# Patient Record
Sex: Male | Born: 1980 | Race: Black or African American | Hispanic: No | Marital: Single | Smoking: Former smoker
Health system: Southern US, Community
[De-identification: ages and names within clinical notes are randomized; demographics above are authoritative.]

## PROBLEM LIST (undated history)

## (undated) DIAGNOSIS — I10 Essential (primary) hypertension: Secondary | ICD-10-CM

---

## 2013-04-14 HISTORY — PX: OTHER SURGICAL HISTORY: SHX169

## 2013-04-19 DIAGNOSIS — S5420XA Injury of radial nerve at forearm level, unspecified arm, initial encounter: Secondary | ICD-10-CM | POA: Insufficient documentation

## 2013-04-25 DIAGNOSIS — T884XXA Failed or difficult intubation, initial encounter: Secondary | ICD-10-CM | POA: Insufficient documentation

## 2013-09-11 ENCOUNTER — Encounter: Payer: Self-pay | Admitting: Plastic Surgery

## 2013-09-25 ENCOUNTER — Encounter: Payer: Self-pay | Admitting: Plastic Surgery

## 2015-01-25 HISTORY — PX: TENDON TRANSPLANT: SHX2488

## 2016-12-20 ENCOUNTER — Emergency Department
Admission: EM | Admit: 2016-12-20 | Discharge: 2016-12-20 | Disposition: A | Discharge status: Home / Self Care | Payer: 59 | Attending: Emergency Medicine | Admitting: Emergency Medicine

## 2016-12-20 DIAGNOSIS — R361 Hematospermia: Secondary | ICD-10-CM | POA: Diagnosis not present

## 2016-12-20 DIAGNOSIS — F17200 Nicotine dependence, unspecified, uncomplicated: Secondary | ICD-10-CM | POA: Insufficient documentation

## 2016-12-20 LAB — URINALYSIS, COMPLETE (UACMP) WITH MICROSCOPIC
Bacteria, UA: NONE SEEN
Bilirubin Urine: NEGATIVE
GLUCOSE, UA: NEGATIVE mg/dL
Ketones, ur: NEGATIVE mg/dL
Leukocytes, UA: NEGATIVE
Nitrite: NEGATIVE
PROTEIN: NEGATIVE mg/dL
SQUAMOUS EPITHELIAL / LPF: NONE SEEN
Specific Gravity, Urine: 1.008 (ref 1.005–1.030)
pH: 7 (ref 5.0–8.0)

## 2016-12-20 LAB — CHLAMYDIA/NGC RT PCR (ARMC ONLY)
Chlamydia Tr: NOT DETECTED
N gonorrhoeae: NOT DETECTED

## 2016-12-20 MED ORDER — CIPROFLOXACIN HCL 500 MG PO TABS: 500.0000 mg | ORAL_TABLET | Freq: Two times a day (BID) | ORAL | 0 refills | Status: AC

## 2016-12-20 NOTE — ED Provider Notes (Signed)
Time Seen: Approximately 2022  I have reviewed the triage notes  Chief Complaint: Penile Discharge   History of Present Illness: Christopher Hardin is a 36 y.o. male *who states he's noticed on 2 separate occasions some blood in his ejaculate material. He denies any hematuria or penile discharge or drainage. He is obviously sexually active with single partner with his wife. He denies her having any issues at this time. He is not aware of any fever or direct trauma. He denies any testicular pain or masses   No past medical history on file.  There are no active problems to display for this patient.   No past surgical history on file.  No past surgical history on file.  Current Outpatient Rx  . Order #: [id number] Class: Print    Allergies:  Patient has no known allergies.  Family History: No family history on file.  Social History: Social History  Substance Use Topics  . Smoking status: Not on file  . Smokeless tobacco: Not on file  . Alcohol use Not on file     Review of Systems:   10 point review of systems was performed and was otherwise negative:  Constitutional: No fever Abdomen: No abdominal pain, no vomiting, No diarrhea Endocrine: No weight loss, No night sweats Urologic: No dysuria, Hematuria, or urinary frequency   Physical Exam:  ED Triage Vitals  Enc Vitals Group     BP 12/20/16 1947 (!) 151/97     Pulse Rate 12/20/16 1947 86     Resp 12/20/16 1947 18     Temp 12/20/16 1947 98.8 F (37.1 C)     Temp Source 12/20/16 1947 Oral     SpO2 12/20/16 1947 100 %     Weight 12/20/16 1945 162 lb (73.5 kg)     Height 12/20/16 1945 5\' 11"  (1.803 m)     Head Circumference --      Peak Flow --      Pain Score --      Pain Loc --      Pain Edu? --      [facility] --     General: Awake , Alert , and Oriented times 3; GCS 15  Lungs: Clear to ascultation without wheezes , rhonchi, or rales Heart: Regular rate, regular rhythm without murmurs ,  gallops , or rubs Abdomen: Soft, non tender without rebound, guarding , or rigidity; bowel sounds positive and symmetric in all 4 quadrants. No organomegaly .         Skin: warm, dry, no rashes Examination of the genitalia shows no penile discharge or drainage. No skin lesions. No testicular pain or masses are noted. Close have a normal position and lie within the scrotal sac with normal cremaster reflex  Labs:   All laboratory work was reviewed including any pertinent negatives or positives listed below:  Labs Reviewed  URINALYSIS, COMPLETE (UACMP) WITH MICROSCOPIC - Abnormal; Notable for the following:       Result Value   Color, Urine COLORLESS (*)    APPearance CLEAR (*)    Hgb urine dipstick SMALL (*)    All other components within normal limits  CHLAMYDIA/NGC RT PCR ([facility] ONLY)    ED Course Patient's stay here was uneventful and given his presentation I felt like this would be a benign pathology for his bloody ejaculate material. Patient was placed on Cipro for possible prostatitis. Skin advised return here if he develops a fever or notices any testicular pain  or masses or any other concerns. GC and chlamydia are pending though I suspected these will be negative and I felt we did not need to treat him here in emergency department with antibiotic therapy. Patient was referred to urology unassigned.  Final Clinical Impression:   Final diagnoses:  Bloody ejaculation     Plan:  Outpatient Patient was advised to return immediately if condition worsens. Patient was advised to follow up with their primary care physician or other specialized physicians involved in their outpatient care. The patient and/or family member/power of attorney had laboratory results reviewed at the bedside. All questions and concerns were addressed and appropriate discharge instructions were distributed by the nursing staff.             Jennye Moccasin, MD 12/20/16 [phone]

## 2016-12-20 NOTE — ED Notes (Signed)
Pt c/o bleeding from his penis

## 2016-12-20 NOTE — ED Triage Notes (Addendum)
Patient reports that when he ejaculates it is bloody.  Denies any problems urinating, other penile discharge or urinary symptoms.

## 2016-12-20 NOTE — Discharge Instructions (Signed)
Blood in the ejaculate material usually turns out to be a benign event. Cause at this time is unknown but if it continues please contact the urologist for further outpatient management.  Please return immediately if condition worsens. Please contact her primary physician or the physician you were given for referral. If you have any specialist physicians involved in her treatment and plan please also contact them. Thank you for using [facility].

## 2016-12-20 NOTE — ED Notes (Signed)
Blood when ejaculating X 1 week.

## 2016-12-20 NOTE — ED Notes (Signed)
ED Provider at bedside. 

## 2016-12-22 ENCOUNTER — Encounter: Payer: Self-pay | Admitting: Urology

## 2016-12-22 ENCOUNTER — Ambulatory Visit: Payer: 59 | Admitting: Urology

## 2016-12-22 VITALS — BP 151/101 | HR 97 | Ht 71.0 in | Wt 174.0 lb

## 2016-12-22 DIAGNOSIS — R361 Hematospermia: Secondary | ICD-10-CM

## 2016-12-22 LAB — URINALYSIS, COMPLETE
Bilirubin, UA: NEGATIVE
GLUCOSE, UA: NEGATIVE
KETONES UA: NEGATIVE
LEUKOCYTES UA: NEGATIVE
NITRITE UA: NEGATIVE
PROTEIN UA: NEGATIVE
RBC, UA: NEGATIVE
SPEC GRAV UA: 1.025 (ref 1.005–1.030)
Urobilinogen, Ur: 0.2 mg/dL (ref 0.2–1.0)
pH, UA: 7 (ref 5.0–7.5)

## 2016-12-22 LAB — MICROSCOPIC EXAMINATION
Bacteria, UA: NONE SEEN
Epithelial Cells (non renal): NONE SEEN /hpf (ref 0–10)
RBC, UA: NONE SEEN /hpf (ref 0–?)
WBC, UA: NONE SEEN /hpf (ref 0–?)

## 2016-12-22 NOTE — Progress Notes (Signed)
12/22/2016 8:03 AM   Helyn NumbersBryon Waldemar DickensJeffrey Mccasland Jul 27, 1981 [id number]  Referring provider: No referring provider defined for this encounter.  Chief Complaint  Patient presents with  . New Patient (Initial Visit)    HPI: Pleasant otherwise healthy 36 year old who presents for further evaluation of hematospermia.  He first noticed this on Thursday and again Saturday last week which was quite alarming to him. He denied any penile trauma, urethral discharge, urethral trauma, pain with ejaculation, change in his urinary symptoms, or any other associated symptoms.  No prior history of blood in his semen. No history of sexually transmitted infections.  He was seen and evaluated in the emergency room over the weekend for this at which time his UA was negative along with GC and chlamydia.  He was prescribed Cipro as a precaution but has only taken 1 tablet thus far.    No family history of prostate cancer or prostate problems.  He is in a monogamous relationship with his wife.     PMH: No past medical history on file.  Surgical History: Past Surgical History:  Procedure Laterality Date  . Arm Surgery Right 04/14/2013  . 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 Always use your most recent med list.          ciprofloxacin 500 MG tablet Commonly known as:  CIPRO Take 1 tablet (500 mg total) by mouth 2 (two) times daily.       Allergies: No Known Allergies  Family History: Family History  Problem Relation Age of Onset  . Bladder Cancer Neg Hx   . Prostate cancer Neg Hx   . Kidney cancer Neg Hx     Social History:  reports that he has been smoking.  He has been smoking about 0.25 packs per day. He has never used smokeless tobacco. He reports that he drinks alcohol. He reports that he does not use drugs.  ROS: UROLOGY Frequent Urination?:  No Hard to postpone urination?: No Burning/pain with urination?: No Get up at night to urinate?: No Leakage of urine?: No Urine stream starts and stops?: No Trouble starting stream?: No Do you have to strain to urinate?: No Blood in urine?: No Urinary tract infection?: No Sexually transmitted disease?: No Injury to kidneys or bladder?: No Painful intercourse?: No Weak stream?: No Erection problems?: No Penile pain?: No  Gastrointestinal Nausea?: No Vomiting?: No Indigestion/heartburn?: No Diarrhea?: No Constipation?: No  Constitutional Fever: No Night sweats?: No Weight loss?: No Fatigue?: No  Skin Skin rash/lesions?: No Itching?: No  Eyes Blurred vision?: No Double vision?: No  Ears/Nose/Throat Sore throat?: No Sinus problems?: No  Hematologic/Lymphatic Swollen glands?: No Easy bruising?: No  Cardiovascular Leg swelling?: No Chest pain?: No  Respiratory Cough?: No Shortness of breath?: No  Endocrine Excessive thirst?: No  Musculoskeletal Back pain?: No Joint pain?: No  Neurological Headaches?: No Dizziness?: No  Psychologic Depression?: No Anxiety?: No  Physical Exam: BP (!) 151/101   Pulse 97   Ht 5\' 11"  (1.803 m)   Wt 174 lb (78.9 kg)   BMI 24.27 kg/m   Constitutional:  Alert and oriented, No acute distress. HEENT: [place] AT, moist mucus membranes.  Trachea midline, no masses. Cardiovascular: No clubbing, cyanosis, or edema. Respiratory: Normal respiratory effort, no increased work of breathing. GI: Abdomen is soft, nontender,  nondistended, no abdominal masses GU: No CVA tenderness. Bilateral descended testicles, no masses. Circumcised phallus with orthotopic meatus. No urethral discharge. Rectal exam: Normal sphincter tone. Small, firm, 30 cc prostate, no nodules. Nontender. Skin: No rashes, bruises or suspicious lesions. Lymph: No inguinal adenopathy. Neurologic: Grossly intact, no focal deficits, moving all 4  extremities. Psychiatric: Normal mood and affect.  Laboratory Data: N/a  Urinalysis Results for orders placed or performed in visit on 12/22/16  Microscopic Examination  Result Value Ref Range   WBC, UA None seen 0 - 5 /hpf   RBC, UA None seen 0 - 2 /hpf   Epithelial Cells (non renal) None seen 0 - 10 /hpf   Bacteria, UA None seen None seen/Few  Urinalysis, Complete  Result Value Ref Range   Specific Gravity, UA 1.025 1.005 - 1.030   pH, UA 7.0 5.0 - 7.5   Color, UA Yellow Yellow   Appearance Ur Clear Clear   Leukocytes, UA Negative Negative   Protein, UA Negative Negative/Trace   Glucose, UA Negative Negative   Ketones, UA Negative Negative   RBC, UA Negative Negative   Bilirubin, UA Negative Negative   Urobilinogen, Ur 0.2 0.2 - 1.0 mg/dL   Nitrite, UA Negative Negative   Microscopic Examination See below:     Pertinent Imaging: n/a  Assessment & Plan:    1. Hematospermia  I explained to the patient some of the conditions that may cause hematospermia, such as: disorders of the prostate gland, seminal vesicles, spermatic cord, and ejaculatory duct system; urogenital infections including sexually transmitted infections (eg, chlamydia, herpes simplex virus, gonorrhea, trichomonas); metastatic cancers; vascular malformations; congenital and drug-induced bleeding disorders; and even frequent daily ejaculation over a period of several weeks.  I reassured him that his exam was normal and that we may never discover a reason for his hematospermia and it is most likely a benign symptom.     I have advised him that it may take some time for his ejaculate to clear, but he should return if this fails to resolve greater than 6 weeks.  - Urinalysis, Complete   Vanna Scotland, MD  [facility] [address] [place], [place] [address] [phone]

## 2017-04-16 DIAGNOSIS — S46219A Strain of muscle, fascia and tendon of other parts of biceps, unspecified arm, initial encounter: Secondary | ICD-10-CM | POA: Diagnosis not present

## 2017-04-23 DIAGNOSIS — R5381 Other malaise: Secondary | ICD-10-CM | POA: Diagnosis not present

## 2017-04-23 DIAGNOSIS — I10 Essential (primary) hypertension: Secondary | ICD-10-CM | POA: Diagnosis not present

## 2017-04-23 DIAGNOSIS — Z0000 Encounter for general adult medical examination without abnormal findings: Secondary | ICD-10-CM | POA: Diagnosis not present

## 2017-04-23 DIAGNOSIS — E784 Other hyperlipidemia: Secondary | ICD-10-CM | POA: Diagnosis not present

## 2017-04-23 DIAGNOSIS — S46219A Strain of muscle, fascia and tendon of other parts of biceps, unspecified arm, initial encounter: Secondary | ICD-10-CM | POA: Diagnosis not present

## 2017-05-03 DIAGNOSIS — S46219A Strain of muscle, fascia and tendon of other parts of biceps, unspecified arm, initial encounter: Secondary | ICD-10-CM | POA: Diagnosis not present

## 2017-07-29 DIAGNOSIS — Z23 Encounter for immunization: Secondary | ICD-10-CM | POA: Diagnosis not present

## 2017-09-12 ENCOUNTER — Encounter: Payer: Self-pay | Admitting: Emergency Medicine

## 2017-09-12 ENCOUNTER — Emergency Department: Payer: 59

## 2017-09-12 ENCOUNTER — Emergency Department
Admission: EM | Admit: 2017-09-12 | Discharge: 2017-09-12 | Disposition: A | Discharge status: Home / Self Care | Payer: 59 | Attending: Emergency Medicine | Admitting: Emergency Medicine

## 2017-09-12 DIAGNOSIS — M25519 Pain in unspecified shoulder: Secondary | ICD-10-CM | POA: Diagnosis present

## 2017-09-12 DIAGNOSIS — F17200 Nicotine dependence, unspecified, uncomplicated: Secondary | ICD-10-CM | POA: Insufficient documentation

## 2017-09-12 DIAGNOSIS — S4991XA Unspecified injury of right shoulder and upper arm, initial encounter: Secondary | ICD-10-CM | POA: Diagnosis not present

## 2017-09-12 DIAGNOSIS — M25511 Pain in right shoulder: Secondary | ICD-10-CM

## 2017-09-12 NOTE — Discharge Instructions (Signed)
use the Motrin for the over the counter pills 3 times a day with food. That should help with the pain. You can use ice 20 minutes every hour if that helps. You can wear the sling with the strap over the right clavicle as we've discussed. Move your shoulder as tolerated. Call the orthopedic surgeon's office on Monday morning and explained to them that you was seen in the emergency room and seemed to have a grade 1 AC joint tear. They should be on a follow you up within a week or so. Please return here for increasing pain numbness or any worsening.

## 2017-09-12 NOTE — ED Triage Notes (Signed)
Patient states that he slipped and fell about 2 hours ago. Patient with complaint of right shoulder pain.

## 2017-09-12 NOTE — ED Notes (Signed)
Patient transported to X-ray 

## 2017-09-12 NOTE — ED Provider Notes (Signed)
[facility] Emergency Department Provider Note   ____________________________________________   First MD Initiated Contact with Patient 09/12/17 [phone]     (approximate)  I have reviewed the triage vital signs and the nursing notes.   HISTORY  Chief Complaint Fall and Shoulder Pain   HPI Christopher Hardin is a 36 y.o. male Who reports he fell a few hours ago. His pain in his shoulders been getting worse. It is localized to the before meals joint area. He has no new numbness or weakness in his hands or fingers or arms    Past Surgical History:  Procedure Laterality Date  . Arm Surgery Right 04/14/2013  . 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  Family History  Problem Relation Age of Onset  . Bladder Cancer Neg Hx   . Prostate cancer Neg Hx   . Kidney cancer Neg Hx     Social History Social History   Tobacco Use  . Smoking status: Current Every Day Smoker    Packs/day: 0.25  . Smokeless tobacco: Never Used  Substance Use Topics  . Alcohol use: Yes  . Drug use: No    Review of Systems  Constitutional: No fever/chills Eyes: No visual changes. ENT: No sore throat. Cardiovascular: Denies chest pain. Respiratory: Denies shortness of breath. Gastrointestinal: No abdominal pain.  No nausea, no vomiting.  No diarrhea.  No constipation. Genitourinary: Negative for dysuria. Musculoskeletal: Negative for back pain. Skin: Negative for rash. Neurological: Negative for headaches, focal weakness  ____________________________________________   PHYSICAL EXAM:  VITAL SIGNS: ED Triage Vitals  Enc Vitals Group     BP 09/12/17 0417 (!) 157/94     Pulse Rate 09/12/17 0417 90     Resp 09/12/17 0417 18     Temp 09/12/17 0417 98.9 F (37.2 C)     Temp Source 09/12/17 0417 Oral     SpO2 09/12/17 0417 100 %     Weight 09/12/17 0410 160 lb  (72.6 kg)     Height 09/12/17 0410 6' (1.829 m)     Head Circumference --      Peak Flow --      Pain Score 09/12/17 0409 10     Pain Loc --      Pain Edu? --      Excl. in [facility]? --     Constitutional: Alert and oriented. Well appearing and in no acute distress. Eyes: Conjunctivae are normal.  Head: Atraumatic. Nose: No congestion/rhinnorhea. Mouth/Throat: Mucous membranes are moist.   Neck: No stridor.   Cardiovascular: Normal rate, regular rhythm.   Good peripheral circulation. Respiratory: Normal respiratory effort.  No retractions.  Musculoskeletal: No lower extremity tenderness nor edema.  No joint effusionsright arm and shoulder show no new numbness or weakness there is point tenderness over the before meals joint. There is no tenderness under the edge of the acromion or in the deltoid.Marland Kitchen. Neurologic:  Normal speech and language. No newgross focal neurologic deficits are appreciated.  Skin:  Skin is warm, dry and intact. No rash noted. Psychiatric: Mood and affect are normal. Speech and behavior are normal.  ____________________________________________   LABS (all labs ordered are listed, but only abnormal results are displayed)  Labs Reviewed - No data to display ____________________________________________  EKG   ____________________________________________  RADIOLOGY  Dg Shoulder Right  Result Date: 09/12/2017 CLINICAL DATA:  Initial  evaluation for acute pain status post fall. EXAM: RIGHT SHOULDER - 2+ VIEW COMPARISON:  None. FINDINGS: There is no evidence of fracture or dislocation. There is no evidence of arthropathy or other focal bone abnormality. Soft tissues are unremarkable. IMPRESSION: No acute osseous abnormality about the right shoulder. Electronically Signed   By: Rise MuBenjamin  McClintock M.D.   On: 09/12/2017 04:36  shoulder x-ray shows no fracture I reviewed the films  ____________________________________________   PROCEDURES  Procedure(s) performed:    Procedures  Critical Care performed:   ____________________________________________   INITIAL IMPRESSION / ASSESSMENT AND PLAN / ED COURSE  patient's exam is consistent with a grade 1 before meals tear. We will attempt to put on a sling and use the strap to put some compression on the before meals joint. This may help itself for me in the past 5 used it. Patient does not want anything but Motrin for pain which is very good.      ____________________________________________   FINAL CLINICAL IMPRESSION(S) / ED DIAGNOSES  Final diagnoses:  Acute pain of right shoulder   likely diagnosis is grade 1 before meals joint tear  ED Discharge Orders    None       Note:  This document was prepared using Dragon voice recognition software and may include unintentional dictation errors.    Arnaldo NatalMalinda, Icker Swigert F, MD 09/12/17 [phone]

## 2017-11-02 DIAGNOSIS — S46219A Strain of muscle, fascia and tendon of other parts of biceps, unspecified arm, initial encounter: Secondary | ICD-10-CM | POA: Diagnosis not present

## 2017-11-02 DIAGNOSIS — I10 Essential (primary) hypertension: Secondary | ICD-10-CM | POA: Diagnosis not present

## 2018-05-02 DIAGNOSIS — I10 Essential (primary) hypertension: Secondary | ICD-10-CM | POA: Diagnosis not present

## 2018-05-02 DIAGNOSIS — S46219D Strain of muscle, fascia and tendon of other parts of biceps, unspecified arm, subsequent encounter: Secondary | ICD-10-CM | POA: Diagnosis not present

## 2018-05-16 DIAGNOSIS — I10 Essential (primary) hypertension: Secondary | ICD-10-CM | POA: Diagnosis not present

## 2018-05-16 DIAGNOSIS — E7849 Other hyperlipidemia: Secondary | ICD-10-CM | POA: Diagnosis not present

## 2018-05-16 DIAGNOSIS — R5381 Other malaise: Secondary | ICD-10-CM | POA: Diagnosis not present

## 2018-05-16 DIAGNOSIS — I119 Hypertensive heart disease without heart failure: Secondary | ICD-10-CM | POA: Diagnosis not present

## 2018-05-16 DIAGNOSIS — S46219A Strain of muscle, fascia and tendon of other parts of biceps, unspecified arm, initial encounter: Secondary | ICD-10-CM | POA: Diagnosis not present

## 2018-05-19 DIAGNOSIS — I119 Hypertensive heart disease without heart failure: Secondary | ICD-10-CM | POA: Diagnosis not present

## 2018-05-19 DIAGNOSIS — S46219A Strain of muscle, fascia and tendon of other parts of biceps, unspecified arm, initial encounter: Secondary | ICD-10-CM | POA: Diagnosis not present

## 2018-08-18 DIAGNOSIS — I119 Hypertensive heart disease without heart failure: Secondary | ICD-10-CM | POA: Diagnosis not present

## 2018-08-18 DIAGNOSIS — S46219A Strain of muscle, fascia and tendon of other parts of biceps, unspecified arm, initial encounter: Secondary | ICD-10-CM | POA: Diagnosis not present

## 2018-08-18 DIAGNOSIS — F17200 Nicotine dependence, unspecified, uncomplicated: Secondary | ICD-10-CM | POA: Diagnosis not present

## 2019-04-11 ENCOUNTER — Other Ambulatory Visit: Payer: Self-pay | Admitting: Family

## 2019-04-11 DIAGNOSIS — R1033 Periumbilical pain: Secondary | ICD-10-CM

## 2019-04-17 ENCOUNTER — Other Ambulatory Visit: Payer: Self-pay

## 2019-04-17 ENCOUNTER — Ambulatory Visit
Admission: RE | Admit: 2019-04-17 | Discharge: 2019-04-17 | Disposition: A | Discharge status: Home / Self Care | Payer: BC Managed Care – PPO | Source: Ambulatory Visit | Attending: Family | Admitting: Family

## 2019-04-17 DIAGNOSIS — R1033 Periumbilical pain: Secondary | ICD-10-CM | POA: Diagnosis present

## 2019-10-02 ENCOUNTER — Other Ambulatory Visit: Payer: Self-pay

## 2019-10-02 ENCOUNTER — Emergency Department
Admission: EM | Admit: 2019-10-02 | Discharge: 2019-10-02 | Disposition: A | Discharge status: Home / Self Care | Payer: BC Managed Care – PPO | Attending: Emergency Medicine | Admitting: Emergency Medicine

## 2019-10-02 ENCOUNTER — Emergency Department: Payer: BC Managed Care – PPO

## 2019-10-02 ENCOUNTER — Encounter: Payer: Self-pay | Admitting: Emergency Medicine

## 2019-10-02 DIAGNOSIS — I10 Essential (primary) hypertension: Secondary | ICD-10-CM | POA: Diagnosis not present

## 2019-10-02 DIAGNOSIS — R109 Unspecified abdominal pain: Secondary | ICD-10-CM | POA: Diagnosis not present

## 2019-10-02 DIAGNOSIS — F17210 Nicotine dependence, cigarettes, uncomplicated: Secondary | ICD-10-CM | POA: Diagnosis not present

## 2019-10-02 LAB — CBC
HCT: 39.4 % (ref 39.0–52.0)
Hemoglobin: 13 g/dL (ref 13.0–17.0)
MCH: 26.6 pg (ref 26.0–34.0)
MCHC: 33 g/dL (ref 30.0–36.0)
MCV: 80.6 fL (ref 80.0–100.0)
Platelets: 210 10*3/uL (ref 150–400)
RBC: 4.89 MIL/uL (ref 4.22–5.81)
RDW: 14.5 % (ref 11.5–15.5)
WBC: 4.2 10*3/uL (ref 4.0–10.5)
nRBC: 0 % (ref 0.0–0.2)

## 2019-10-02 LAB — BASIC METABOLIC PANEL
Anion gap: 15 (ref 5–15)
BUN: 10 mg/dL (ref 6–20)
CO2: 22 mmol/L (ref 22–32)
Calcium: 9.6 mg/dL (ref 8.9–10.3)
Chloride: 102 mmol/L (ref 98–111)
Creatinine, Ser: 0.88 mg/dL (ref 0.61–1.24)
GFR calc Af Amer: >60 mL/min (ref >60)
GFR calc non Af Amer: >60 mL/min (ref >60)
Glucose, Bld: 95 mg/dL (ref 70–99)
Potassium: 3.5 mmol/L (ref 3.5–5.1)
Sodium: 139 mmol/L (ref 135–145)

## 2019-10-02 LAB — URINALYSIS, COMPLETE (UACMP) WITH MICROSCOPIC
Bacteria, UA: NONE SEEN
Bilirubin Urine: NEGATIVE
Glucose, UA: NEGATIVE mg/dL
Hgb urine dipstick: NEGATIVE
Ketones, ur: 5 mg/dL — AB
Leukocytes,Ua: NEGATIVE
Nitrite: NEGATIVE
Protein, ur: 30 mg/dL — AB
Specific Gravity, Urine: 1.021 (ref 1.005–1.030)
Squamous Epithelial / LPF: NONE SEEN (ref 0–5)
pH: 7 (ref 5.0–8.0)

## 2019-10-02 LAB — HEPATIC FUNCTION PANEL
ALT: 41 U/L (ref 0–44)
AST: 50 U/L — ABNORMAL HIGH (ref 15–41)
Albumin: 4.7 g/dL (ref 3.5–5.0)
Alkaline Phosphatase: 35 U/L — ABNORMAL LOW (ref 38–126)
Bilirubin, Direct: <0.1 mg/dL (ref 0.0–0.2)
Total Bilirubin: 0.5 mg/dL (ref 0.3–1.2)
Total Protein: 8 g/dL (ref 6.5–8.1)

## 2019-10-02 LAB — LIPASE, BLOOD: Lipase: 22 U/L (ref 11–51)

## 2019-10-02 MED ORDER — HYDROCHLOROTHIAZIDE 12.5 MG PO TABS: 12.5000 mg | ORAL_TABLET | Freq: Every day | ORAL | 0 refills | Status: DC

## 2019-10-02 MED ORDER — LIDOCAINE 5 % EX PTCH
1.0000 | MEDICATED_PATCH | CUTANEOUS | Status: DC
  Administered 2019-10-02: 1 via TRANSDERMAL
  Filled 2019-10-02: qty 1

## 2019-10-02 MED ORDER — KETOROLAC TROMETHAMINE 30 MG/ML IJ SOLN
30.0000 mg | Freq: Once | INTRAMUSCULAR | Status: AC
  Administered 2019-10-02: 30 mg via INTRAMUSCULAR
  Filled 2019-10-02: qty 1

## 2019-10-02 MED ORDER — CYCLOBENZAPRINE HCL 5 MG PO TABS: 5.0000 mg | ORAL_TABLET | Freq: Three times a day (TID) | ORAL | 0 refills | Status: DC | PRN

## 2019-10-02 MED ORDER — ACETAMINOPHEN 500 MG PO TABS
1000.0000 mg | ORAL_TABLET | Freq: Once | ORAL | Status: AC
  Administered 2019-10-02: 1000 mg via ORAL
  Filled 2019-10-02: qty 2

## 2019-10-02 NOTE — ED Triage Notes (Signed)
Pt reports started with intermittent pain to left flank and back. Pt reports that now the pain is constant.

## 2019-10-02 NOTE — ED Notes (Signed)
Sent green, purple tubes, and urine to lab.

## 2019-10-02 NOTE — ED Notes (Addendum)
Pt states he has high BP,but is not taking medication at this time.

## 2019-10-02 NOTE — ED Provider Notes (Signed)
[facility] Emergency Department Provider Note  ____________________________________________   First MD Initiated Contact with Patient 10/02/19 1443     (approximate)  I have reviewed the triage vital signs and the nursing notes.   HISTORY  Chief Complaint Flank Pain and Back Pain    HPI Christopher Hardin is a 38 y.o. male otherwise healthy comes in with left flank pain.  Patient states his pain started 3 days ago.  Initially was intermittent stabbing but now has been more constant.  The pain is constant, deep sharp sensation, nothing makes it better, nothing makes it worse.  Denies feeling like he strained it.  No prior kidney stones.  No burning when he urinates, no hematuria.  No testicle pain.    History reviewed. No pertinent past medical history.  There are no active problems to display for this patient.   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  Family History  Problem Relation Age of Onset   Bladder Cancer Neg Hx    Prostate cancer Neg Hx    Kidney cancer Neg Hx     Social History Social History   Tobacco Use   Smoking status: Current Every Day Smoker    Packs/day: 0.25   Smokeless tobacco: Never Used  Substance Use Topics   Alcohol use: Yes   Drug use: No      Review of Systems Constitutional: No fever/chills Eyes: No visual changes. ENT: No sore throat. Cardiovascular: Denies chest pain. Respiratory: Denies shortness of breath. Gastrointestinal: No abdominal pain.  No nausea, no vomiting.  No diarrhea.  No constipation. Genitourinary: Negative for dysuria. Musculoskeletal: Positive left flank pain Skin: Negative for rash. Neurological: Negative for headaches, focal weakness or numbness. All other ROS  negative ____________________________________________   PHYSICAL EXAM:  VITAL SIGNS: ED Triage Vitals  Enc Vitals Group     BP 10/02/19 1422 (!) 179/97     Pulse Rate 10/02/19 1422 83     Resp 10/02/19 1422 19     Temp 10/02/19 1422 98.4 F (36.9 C)     Temp Source 10/02/19 1422 Oral     SpO2 10/02/19 1422 100 %     Weight 10/02/19 1336 168 lb (76.2 kg)     Height 10/02/19 1336 6' (1.829 m)     Head Circumference --      Peak Flow --      Pain Score 10/02/19 1335 7     Pain Loc --      Pain Edu? --      Excl. in GC? --     Constitutional: Alert and oriented. Well appearing and in no acute distress. Eyes: Conjunctivae are normal. EOMI. Head: Atraumatic. Nose: No congestion/rhinnorhea. Mouth/Throat: Mucous membranes are moist.   Neck: No stridor. Trachea Midline. FROM Cardiovascular: Normal rate, regular rhythm. Grossly normal heart sounds.  Good peripheral circulation. Respiratory: Normal respiratory effort.  No retractions. Lungs CTAB. Gastrointestinal: Soft and nontender. No distention. No abdominal bruits.  Musculoskeletal: No lower extremity tenderness nor edema.  No joint effusions.  Tenderness to his left flank.  No skin changes. Neurologic:  Normal speech and language. No gross focal neurologic deficits are appreciated.  Skin:  Skin is warm, dry and intact. No rash noted. Psychiatric: Mood and affect are normal.  Speech and behavior are normal. GU: Deferred   ____________________________________________   LABS (all labs ordered are listed, but only abnormal results are displayed)  Labs Reviewed  URINALYSIS, COMPLETE (UACMP) WITH MICROSCOPIC - Abnormal; Notable for the following components:      Result Value   Color, Urine YELLOW (*)    APPearance CLEAR (*)    Ketones, ur 5 (*)    Protein, ur 30 (*)    All other components within normal limits  BASIC METABOLIC PANEL  CBC  HEPATIC FUNCTION PANEL  LIPASE, BLOOD    ____________________________________________   ED ECG REPORT I, Vanessa Ewing, the attending physician, personally viewed and interpreted this ECG.   ____________________________________________  RADIOLOGY   Official radiology report(s): Ct Renal Stone Study  Result Date: 10/02/2019 CLINICAL DATA:  Flank pain, stone disease suspected, intermittent pain to left flank and back. EXAM: CT ABDOMEN AND PELVIS WITHOUT CONTRAST TECHNIQUE: Multidetector CT imaging of the abdomen and pelvis was performed following the standard protocol without IV contrast. COMPARISON:  Abdominal ultrasound 04/17/2019 FINDINGS: Lower chest: Lung bases are clear. Normal heart size. No pericardial effusion. Hepatobiliary: Hypoattenuating focus adjacent the falciform ligament compatible with focal fatty infiltration. No worrisome focal liver abnormality is seen. No gallstones, gallbladder wall thickening, or biliary dilatation. Pancreas: Unremarkable. No pancreatic ductal dilatation or surrounding inflammatory changes. Spleen: Normal in size without focal abnormality. Adrenals/Urinary Tract: Adrenal glands are unremarkable. Kidneys are normal, without renal calculi, focal lesion, or hydronephrosis. Urinary bladder is largely decompressed at the time of exam and therefore poorly evaluated by CT imaging. Stomach/Bowel: Distal esophagus, stomach and duodenal sweep are unremarkable. No small bowel wall thickening or dilatation. No evidence of obstruction. A normal appendix is visualized. No colonic dilatation or wall thickening. Vascular/Lymphatic: The aorta is normal caliber. No suspicious or enlarged lymph nodes in the included lymphatic chains. Reproductive: The prostate and seminal vesicles are unremarkable. Other: No abdominopelvic free fluid or free gas. No bowel containing hernias. Musculoskeletal: Abrupt coccygeal angulation without lucency may reflect prior coccygeal fracture. No acute osseous abnormality or suspicious  osseous lesion. IMPRESSION: No acute CT findings to explain the patient's symptoms. Specifically, no evidence of urolithiasis or hydronephrosis. Electronically Signed   By: Lovena Le M.D.   On: 10/02/2019 15:08    ____________________________________________   PROCEDURES  Procedure(s) performed (including Critical Care):  Procedures   ____________________________________________   INITIAL IMPRESSION / ASSESSMENT AND PLAN / ED COURSE  Christopher Hardin was evaluated in Emergency Department on 10/02/2019 for the symptoms described in the history of present illness. He was evaluated in the context of the global COVID-19 pandemic, which necessitated consideration that the patient might be at risk for infection with the SARS-CoV-2 virus that causes COVID-19. Institutional protocols and algorithms that pertain to the evaluation of patients at risk for COVID-19 are in a state of rapid change based on information released by regulatory bodies including the CDC and federal and state organizations. These policies and algorithms were followed during the patient's care in the ED.    Patient is a well-appearing 38 year old who presents with left flank pain.  Will get CT scan to evaluate for renal stone.  Low suspicion for appendicitis, SBO, diverticulitis given location of the pain.  If work-up is negative consider musculoskeletal.  Will get labs to evaluate for UTI, Pyelo.  Denies any shortness of breath or cough to suggest pneumonia.  Denies any chest pain to suggest ACS, dissection.  Equal pulses as well.  Patient is noted  to be hypertensive he states that his blood pressure lesions because he does not want to.  Patient was informed to take these and he should follow-up with his primary care doctor.  We will start him on a low-dose of hydrochlorothiazide until he can get follow-up.   Labs are reassuring with no white count elevation.  Urine without evidence of UTI.  CT scan is negative.  No  evidence pancreatitis.  AST is slightly elevated patient follow-up as outpatient.  Discussed with patient work-up above.  Most likely musculoskeletal in nature.  Recommended Tylenol and ibuprofen and Flexeril for breakthrough pain instructed not to drive along nausea.  Patient is understanding but comfortable with this plan and understands return precautions such as worsening pain, vomiting, fevers.  I discussed the provisional nature of ED diagnosis, the treatment so far, the ongoing plan of care, follow up appointments and return precautions with the patient and any family or support people present. They expressed understanding and agreed with the plan, discharged home.  ____________________________________________   FINAL CLINICAL IMPRESSION(S) / ED DIAGNOSES   Final diagnoses:  Left flank pain  Hypertension, unspecified type      MEDICATIONS GIVEN DURING THIS VISIT:  Medications  lidocaine (LIDODERM) 5 % 1 patch (1 patch Transdermal Patch Applied 10/02/19 1453)  acetaminophen (TYLENOL) tablet 1,000 mg (1,000 mg Oral Given 10/02/19 1453)  ketorolac (TORADOL) 30 MG/ML injection 30 mg (30 mg Intramuscular Given 10/02/19 1619)     ED Discharge Orders         Ordered    cyclobenzaprine (FLEXERIL) 5 MG tablet  3 times daily PRN     10/02/19 1621    hydrochlorothiazide (HYDRODIURIL) 12.5 MG tablet  Daily     10/02/19 1621           Note:  This document was prepared using Dragon voice recognition software and may include unintentional dictation errors.   Concha Se, MD 10/02/19 [phone]

## 2019-10-02 NOTE — Discharge Instructions (Addendum)
Your labs show a slightly elevated AST will need to have this rechecked in the next few weeks just to make sure is downtrending.  This is most likely musculoskeletal in nature.  Take ibuprofen 400 every 8 hours and Tylenol 1 g every 8 hours.  Take the Flexeril at nighttime.  Do not drive with this.  Your blood pressure is also significantly elevated.  Seem to be important you restart your blood pressure medications.  I have started you on low dose medicine.  follow-up with your regular doctor in one week for Bp recheck.  Over time this can be life-threatening if you do not get this under control.  Return to the ER for worsening pain or any other concerns.    IMPRESSION: No acute CT findings to explain the patient's symptoms. Specifically, no evidence of urolithiasis or hydronephrosis.

## 2019-10-09 ENCOUNTER — Other Ambulatory Visit: Payer: Self-pay

## 2019-10-09 DIAGNOSIS — Z20822 Contact with and (suspected) exposure to covid-19: Secondary | ICD-10-CM

## 2019-10-10 LAB — NOVEL CORONAVIRUS, NAA: SARS-CoV-2, NAA: NOT DETECTED

## 2019-12-20 ENCOUNTER — Ambulatory Visit (INDEPENDENT_AMBULATORY_CARE_PROVIDER_SITE_OTHER): Payer: BC Managed Care – PPO | Admitting: Urology

## 2019-12-20 ENCOUNTER — Encounter: Payer: Self-pay | Admitting: Urology

## 2019-12-20 ENCOUNTER — Other Ambulatory Visit: Payer: Self-pay

## 2019-12-20 VITALS — BP 164/107 | HR 101 | Ht 72.0 in | Wt 170.0 lb

## 2019-12-20 DIAGNOSIS — R310 Gross hematuria: Secondary | ICD-10-CM

## 2019-12-20 LAB — URINALYSIS, COMPLETE
Bilirubin, UA: NEGATIVE
Glucose, UA: NEGATIVE
Ketones, UA: NEGATIVE
Leukocytes,UA: NEGATIVE
Nitrite, UA: NEGATIVE
RBC, UA: NEGATIVE
Specific Gravity, UA: 1.02 (ref 1.005–1.030)
Urobilinogen, Ur: 0.2 mg/dL (ref 0.2–1.0)
pH, UA: 7 (ref 5.0–7.5)

## 2019-12-20 LAB — MICROSCOPIC EXAMINATION
Bacteria, UA: NONE SEEN
Epithelial Cells (non renal): NONE SEEN /hpf (ref 0–10)
RBC: NONE SEEN /hpf (ref 0–2)

## 2019-12-20 NOTE — Progress Notes (Signed)
12/20/2019 10:50 AM   Ginnie Smart Basil Dess [id number] [id number]  Referring provider: Cletis Athens, MD [address] [place],  [place] [address]  Chief Complaint  Patient presents with  . Hematuria    HPI: Christopher Hardin 38 y.o. male seen in consultation at request of Dr. Lavera Guise for evaluation of gross hematuria.  Approximately 1 month ago he had a 1 week history of initial stream hematuria.  He denied dysuria, frequency or urgency.  No flank, abdominal or pelvic pain.  The day he was seen by his PCP he passed a small clot and has not had any further hematuria since that time.  He has no complaints today.  He has a previous tobacco history.  Only a dipstick urine was performed at his PCP evaluation which showed 2+ blood, 2+ protein and large leukocytes.  Urine was nitrite negative.  No culture was ordered however he was treated with Cipro.  He saw Dr. 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  He was seen in the ED on 10/02/2019 complaining of a 3-day history of left flank pain.  Urinalysis was performed which was unremarkable.  He had a stone protocol CT of the abdomen pelvis which negative for calculi or hydronephrosis.  His pain subsequently resolved   PMH: History reviewed. No pertinent past medical history.  Surgical History: Past Surgical History:  Procedure Laterality Date  . Arm Surgery Right 04/14/2013  . 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 If you have any questions, ask your nurse or doctor.        cyclobenzaprine 5 MG tablet Commonly known as: FLEXERIL Take 1 tablet (5 mg total) by mouth 3 (three) times daily as needed for muscle spasms.   hydrochlorothiazide 12.5 MG tablet Commonly known as: HYDRODIURIL Take 1 tablet (12.5 mg total) by mouth daily.   losartan 100 MG tablet Commonly known as: COZAAR Take 100 mg by  mouth daily.       Allergies: No Known Allergies  Family History: Family History  Problem Relation Age of Onset  . Bladder Cancer Neg Hx   . Prostate cancer Neg Hx   . Kidney cancer Neg Hx     Social History:  reports that he has been smoking. He has been smoking about 0.25 packs per day. He has never used smokeless tobacco. He reports current alcohol use. He reports that he does not use drugs.   Physical Exam: BP (!) 164/107   Pulse (!) 101   Ht 6' (1.829 m)   Wt 170 lb (77.1 kg)   BMI 23.06 kg/m   Constitutional:  Alert and oriented, No acute distress. HEENT: [place] AT, moist mucus membranes.  Trachea midline, no masses. Cardiovascular: No clubbing, cyanosis, or edema. Respiratory: Normal respiratory effort, no increased work of breathing. Skin: No rashes, bruises or suspicious lesions. Neurologic: Grossly intact, no focal deficits, moving all 4 extremities. Psychiatric: Normal mood and affect.    Pertinent Imaging: CT images were personally reviewed  Results for orders placed during the hospital encounter of 10/02/19  CT Renal Stone Study   Narrative CLINICAL DATA:  Flank pain, stone disease suspected, intermittent pain to left flank and back.  EXAM: CT ABDOMEN AND PELVIS WITHOUT CONTRAST  TECHNIQUE: Multidetector CT imaging of the abdomen and pelvis was performed following the standard protocol  without IV contrast.  COMPARISON:  Abdominal ultrasound 04/17/2019  FINDINGS: Lower chest: Lung bases are clear. Normal heart size. No pericardial effusion.  Hepatobiliary: Hypoattenuating focus adjacent the falciform ligament compatible with focal fatty infiltration. No worrisome focal liver abnormality is seen. No gallstones, gallbladder wall thickening, or biliary dilatation.  Pancreas: Unremarkable. No pancreatic ductal dilatation or surrounding inflammatory changes.  Spleen: Normal in size without focal abnormality.  Adrenals/Urinary Tract: Adrenal glands  are unremarkable. Kidneys are normal, without renal calculi, focal lesion, or hydronephrosis. Urinary bladder is largely decompressed at the time of exam and therefore poorly evaluated by CT imaging.  Stomach/Bowel: Distal esophagus, stomach and duodenal sweep are unremarkable. No small bowel wall thickening or dilatation. No evidence of obstruction. A normal appendix is visualized. No colonic dilatation or wall thickening.  Vascular/Lymphatic: The aorta is normal caliber. No suspicious or enlarged lymph nodes in the included lymphatic chains.  Reproductive: The prostate and seminal vesicles are unremarkable.  Other: No abdominopelvic free fluid or free gas. No bowel containing hernias.  Musculoskeletal: Abrupt coccygeal angulation without lucency may reflect prior coccygeal fracture. No acute osseous abnormality or suspicious osseous lesion.  IMPRESSION: No acute CT findings to explain the patient's symptoms. Specifically, no evidence of urolithiasis or hydronephrosis.   Electronically Signed   By: Kreg Shropshire M.D.   On: 10/02/2019 15:08     Assessment & Plan:    - Gross hematuria Urinalysis today without microhematuria or pyuria.  He had initial stream hematuria which is most likely prostatic in origin.  Since he had a noncontrast CT of the abdomen pelvis I recommended scheduling cystoscopy.  For recurrent gross hematuria or microhematuria will need to proceed with a CT urogram.   Riki Altes, MD  [facility] [address], [address] [place], [place] [address] [phone]

## 2019-12-21 ENCOUNTER — Encounter: Payer: Self-pay | Admitting: Urology

## 2019-12-21 ENCOUNTER — Ambulatory Visit: Payer: BC Managed Care – PPO | Attending: Internal Medicine

## 2019-12-21 ENCOUNTER — Telehealth: Payer: Self-pay | Admitting: *Deleted

## 2019-12-21 DIAGNOSIS — Z20822 Contact with and (suspected) exposure to covid-19: Secondary | ICD-10-CM

## 2019-12-21 NOTE — Telephone Encounter (Signed)
Notified patient as instructed, patient pleased. Discussed follow-up appointments, patient agrees  

## 2019-12-21 NOTE — Telephone Encounter (Signed)
-----   Message from Riki Altes, MD sent at 12/21/2019  7:51 AM EST ----- Regarding: CT Please let patient know since he had a CT in December 2020 will not need to repeat at this time.  Keep cystoscopy appointment.

## 2019-12-22 LAB — NOVEL CORONAVIRUS, NAA: SARS-CoV-2, NAA: NOT DETECTED

## 2020-01-10 ENCOUNTER — Other Ambulatory Visit: Payer: Self-pay

## 2020-01-10 ENCOUNTER — Encounter: Payer: Self-pay | Admitting: Urology

## 2020-01-10 ENCOUNTER — Ambulatory Visit (INDEPENDENT_AMBULATORY_CARE_PROVIDER_SITE_OTHER): Payer: Managed Care, Other (non HMO) | Admitting: Urology

## 2020-01-10 VITALS — BP 148/87 | HR 94 | Ht 72.0 in | Wt 175.0 lb

## 2020-01-10 DIAGNOSIS — R310 Gross hematuria: Secondary | ICD-10-CM | POA: Diagnosis not present

## 2020-01-10 NOTE — Progress Notes (Signed)
   01/10/20  CC:  Chief Complaint  Patient presents with  . Cysto   Indications: History of gross hematuria  HPI: Denies recurrent hematuria  Blood pressure (!) 148/87, pulse 94, height 6' (1.829 m), weight 175 lb (79.4 kg). NED. A&Ox3.   No respiratory distress   Abd soft, NT, ND Normal phallus with bilateral descended testicles  Cystoscopy Procedure Note  Patient identification was confirmed, informed consent was obtained, and patient was prepped using Betadine solution.  Lidocaine jelly was administered per urethral meatus.     Pre-Procedure: - Inspection reveals a normal caliber urethral meatus.  Procedure: The flexible cystoscope was introduced without difficulty - No urethral strictures/lesions are present. - Mild lateral lobe enlargement prostate  - Mild elevation bladder neck - Bilateral ureteral orifices identified - Bladder mucosa  reveals no ulcers, tumors, or lesions - No bladder stones - No trabeculation  Retroflexion shows no abnormalities   Post-Procedure: - Patient tolerated the procedure well  Assessment/ Plan: -No abnormalities noted on cystoscopy -Urine cytology sent -Follow-up 6 months for office visit/UA -Instructed to call earlier for recurrent gross hematuria -We will need CT urogram for recurrent gross hematuria or clinically significant microhematuria   Riki Altes, MD

## 2020-01-11 LAB — URINALYSIS, COMPLETE
Bilirubin, UA: NEGATIVE
Glucose, UA: NEGATIVE
Ketones, UA: NEGATIVE
Leukocytes,UA: NEGATIVE
Nitrite, UA: NEGATIVE
Protein,UA: NEGATIVE
RBC, UA: NEGATIVE
Specific Gravity, UA: 1.02 (ref 1.005–1.030)
Urobilinogen, Ur: 0.2 mg/dL (ref 0.2–1.0)
pH, UA: 7.5 (ref 5.0–7.5)

## 2020-01-11 LAB — MICROSCOPIC EXAMINATION
Epithelial Cells (non renal): NONE SEEN /hpf (ref 0–10)
RBC: NONE SEEN /hpf (ref 0–2)

## 2020-01-16 ENCOUNTER — Other Ambulatory Visit: Payer: Self-pay | Admitting: Urology

## 2020-01-18 ENCOUNTER — Telehealth: Payer: Self-pay | Admitting: Urology

## 2020-01-18 NOTE — Telephone Encounter (Signed)
Urine culture showed no abnormal cells.  Follow-up as scheduled

## 2020-01-19 NOTE — Telephone Encounter (Signed)
Left message on cell phone

## 2020-07-24 ENCOUNTER — Ambulatory Visit: Payer: Self-pay | Admitting: Urology

## 2021-12-01 ENCOUNTER — Other Ambulatory Visit: Payer: Self-pay

## 2021-12-01 ENCOUNTER — Emergency Department
Admission: EM | Admit: 2021-12-01 | Discharge: 2021-12-01 | Disposition: A | Discharge status: Home / Self Care | Payer: Managed Care, Other (non HMO) | Attending: Emergency Medicine | Admitting: Emergency Medicine

## 2021-12-01 ENCOUNTER — Emergency Department: Payer: Managed Care, Other (non HMO)

## 2021-12-01 DIAGNOSIS — R200 Anesthesia of skin: Secondary | ICD-10-CM | POA: Diagnosis not present

## 2021-12-01 DIAGNOSIS — R079 Chest pain, unspecified: Secondary | ICD-10-CM

## 2021-12-01 DIAGNOSIS — I10 Essential (primary) hypertension: Secondary | ICD-10-CM | POA: Insufficient documentation

## 2021-12-01 HISTORY — DX: Essential (primary) hypertension: I10

## 2021-12-01 LAB — URINE DRUG SCREEN, QUALITATIVE (ARMC ONLY)
Amphetamines, Ur Screen: NOT DETECTED
Barbiturates, Ur Screen: NOT DETECTED
Benzodiazepine, Ur Scrn: NOT DETECTED
Cannabinoid 50 Ng, Ur ~~LOC~~: NOT DETECTED
Cocaine Metabolite,Ur ~~LOC~~: NOT DETECTED
MDMA (Ecstasy)Ur Screen: NOT DETECTED
Methadone Scn, Ur: NOT DETECTED
Opiate, Ur Screen: NOT DETECTED
Phencyclidine (PCP) Ur S: NOT DETECTED
Tricyclic, Ur Screen: NOT DETECTED

## 2021-12-01 LAB — BASIC METABOLIC PANEL
Anion gap: 9 (ref 5–15)
BUN: 16 mg/dL (ref 6–20)
CO2: 28 mmol/L (ref 22–32)
Calcium: 9.4 mg/dL (ref 8.9–10.3)
Chloride: 105 mmol/L (ref 98–111)
Creatinine, Ser: 1.12 mg/dL (ref 0.61–1.24)
GFR, Estimated: >60 mL/min (ref >60)
Glucose, Bld: 95 mg/dL (ref 70–99)
Potassium: 3.4 mmol/L — ABNORMAL LOW (ref 3.5–5.1)
Sodium: 142 mmol/L (ref 135–145)

## 2021-12-01 LAB — CBC
HCT: 40.8 % (ref 39.0–52.0)
Hemoglobin: 13.4 g/dL (ref 13.0–17.0)
MCH: 26.9 pg (ref 26.0–34.0)
MCHC: 32.8 g/dL (ref 30.0–36.0)
MCV: 81.8 fL (ref 80.0–100.0)
Platelets: 275 10*3/uL (ref 150–400)
RBC: 4.99 MIL/uL (ref 4.22–5.81)
RDW: 15 % (ref 11.5–15.5)
WBC: 5.2 10*3/uL (ref 4.0–10.5)
nRBC: 0 % (ref 0.0–0.2)

## 2021-12-01 LAB — TROPONIN I (HIGH SENSITIVITY)
Troponin I (High Sensitivity): 15 ng/L (ref <18)
Troponin I (High Sensitivity): 15 ng/L (ref <18)

## 2021-12-01 MED ORDER — HYDROCHLOROTHIAZIDE 12.5 MG PO TABS
12.5000 mg | ORAL_TABLET | Freq: Once | ORAL | Status: AC
  Administered 2021-12-01: 12.5 mg via ORAL
  Filled 2021-12-01: qty 1

## 2021-12-01 MED ORDER — HYDROCHLOROTHIAZIDE 25 MG PO TABS: 25.0000 mg | ORAL_TABLET | Freq: Every day | ORAL | 1 refills | Status: DC

## 2021-12-01 MED ORDER — ASPIRIN 81 MG PO CHEW
324.0000 mg | CHEWABLE_TABLET | Freq: Once | ORAL | Status: AC
Start: 2021-12-01 — End: 2021-12-01
  Administered 2021-12-01: 324 mg via ORAL
  Filled 2021-12-01: qty 4

## 2021-12-01 NOTE — Discharge Instructions (Signed)

## 2021-12-01 NOTE — ED Triage Notes (Addendum)
Centralized sharp chest pain that began a few hours ago. EMS came to home was brought to ER by POV. Pt reports pain "not bad" described as 2/10. Pt with hx of hypertension, has not ever taken his prescribed medication. Pt hesitant to answer questions. Family at bedside.

## 2021-12-01 NOTE — ED Provider Notes (Signed)
[facility] Provider Note    Event Date/Time   First MD Initiated Contact with Patient 12/01/21 0401     (approximate)   History   Chest Pain   HPI  Christopher Hardin is a 41 y.o. male with history of hypertension noncompliant with medications who presents for evaluation of chest pain.  Patient reports that the chest pain started this evening.  He describes the pain as sharp located in the center of his chest and intermittent lasting seconds at a time.  When the pain comes on he feels some numbness of his left arm.  The pain has come both at rest and exertion.  He denies ever having similar pain.  He vapes.  He denies any family history of heart disease.  He denies any drug use.  He describes the pain as mild, 2/10.  He has not been taking his antihypertensive medications for over a year. Np pain currently.  No personal family history.  DVT, no recent travel immobilization, no leg pain or swelling, no hemoptysis or exogenous hormones.     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      Excl. in [place]?     Most recent vital signs: Vitals:   12/01/21 0331  BP: (!) 169/114  Pulse: 88  Resp: 14  Temp: 97.9 F (36.6 C)  SpO2: 98%     Constitutional: Alert and oriented. Well appearing and in no apparent distress. HEENT:      Head: Normocephalic and atraumatic.         Eyes: Conjunctivae are normal. Sclera is non-icteric.       Mouth/Throat: Mucous membranes are  moist.       Neck: Supple with no signs of meningismus. Cardiovascular: Regular rate and rhythm. No murmurs, gallops, or rubs. 2+ symmetrical distal pulses are present in all extremities.  Respiratory: Normal respiratory effort. Lungs are clear to auscultation bilaterally.  Gastrointestinal: Soft, non tender, and non distended with positive bowel sounds. No rebound or guarding. Genitourinary: No CVA tenderness. Musculoskeletal:  No edema, cyanosis, or erythema of extremities. Neurologic: Normal speech and language. Face is symmetric. Moving all extremities. No gross focal neurologic deficits are appreciated. Skin: Skin is warm, dry and intact. No rash noted. Psychiatric: Mood and affect are normal. Speech and behavior are normal.  ED Results / Procedures / Treatments   Labs (all labs ordered are listed, but only abnormal results are displayed) Labs Reviewed  BASIC METABOLIC PANEL - Abnormal; Notable for the following components:      Result Value   Potassium 3.4 (*)    All other components within normal limits  CBC  URINE  DRUG SCREEN, QUALITATIVE ([facility] ONLY)  TROPONIN I (HIGH SENSITIVITY)  TROPONIN I (HIGH SENSITIVITY)     EKG  ED ECG REPORT I, Rudene Re, the attending physician, personally viewed and interpreted this ECG.  Sinus rhythm with LVH, no ST elevations or depressions.   RADIOLOGY I, Rudene Re, attending MD, have personally viewed and interpreted the images obtained during this visit as below:  Chest x-ray negative   ___________________________________________________ Interpretation by Radiologist:  DG Chest 2 View  Result Date: 12/01/2021 CLINICAL DATA:  41 year old male with sharp chest pain acute onset. EXAM: CHEST - 2 VIEW COMPARISON:  CT Abdomen and Pelvis 10/02/2019. FINDINGS: Normal lung volumes and mediastinal contours. Visualized tracheal air column is within normal limits. Both lungs appear clear. No pneumothorax or pleural effusion. No  osseous abnormality identified.  Negative visible bowel gas. IMPRESSION: Negative.  No cardiopulmonary abnormality. Electronically Signed   By: Genevie Ann M.D.   On: 12/01/2021 03:59      PROCEDURES:  Critical Care performed: No  Procedures    IMPRESSION / MDM / ASSESSMENT AND PLAN / ED COURSE  I reviewed the triage vital signs and the nursing notes.  41 y.o. male with history of hypertension noncompliant with medications who presents for evaluation of chest pain.  Patient with several episodes lasting seconds at a time of chest pain with left arm numbness.  Patient is well-appearing in no distress, slightly hypertensive, intact pulses in all 4 extremities, no abdominal tenderness, heart regular rate and rhythm, legs with no asymmetric swelling.  Ddx: Sounds atypical for ACS although with uncontrolled hypertension and history of vaping and former smoker we will pursue ACS, MSK, pleurisy, esophageal spasm.  Patient is PERC negative.   Plan: EKG, troponin x2, chest x-ray, cardiac telemetry for monitoring of cardiorespiratory status, CBC, metabolic panel, chest x-ray.  We will give a full aspirin and restart patient on his hydrochlorothiazide.   MEDICATIONS GIVEN IN ED: Medications  aspirin chewable tablet 324 mg (324 mg Oral Given 12/01/21 0446)  hydrochlorothiazide (HYDRODIURIL) tablet 12.5 mg (12.5 mg Oral Given 12/01/21 0447)     ED COURSE: 2 high-sensitivity troponins negative.  Chest x-ray with no acute findings.  EKG nonischemic.  UDS negative.  Patient remains pain-free.  Will discharge home with a prescription for hydrochlorothiazide, recommended daily baby aspirin and referred to cardiology.  Admission was considered but with no pain and negative work-up I feel the patient is safe for discharge home and outpatient follow-up.  Discussed my standard return precautions.   Consults: None   EMR reviewed including patient's last visit with his urologist for hematuria from  [place] IMPRESSION(S) / ED DIAGNOSES   Final diagnoses:  Chest pain, unspecified type  Primary hypertension     Rx / DC Orders   ED Discharge Orders          Ordered    hydrochlorothiazide (HYDRODIURIL) 25 MG tablet  Daily        12/01/21 0627    Ambulatory referral to Cardiology       Comments: Chest pain, HTN   12/01/21 [id number]             Note:  This document was prepared using Dragon voice recognition software and may include unintentional dictation errors.   Please note:  Patient was evaluated in Emergency Department today for the symptoms described in the history of present illness. Patient was evaluated in the context of the global COVID-19 pandemic, which necessitated consideration that  the patient might be at risk for infection with the SARS-CoV-2 virus that causes COVID-19. Institutional protocols and algorithms that pertain to the evaluation of patients at risk for COVID-19 are in a state of rapid change based on information released by regulatory bodies including the CDC and federal and state organizations. These policies and algorithms were followed during the patient's care in the ED.  Some ED evaluations and interventions may be delayed as a result of limited staffing during the pandemic.       Alfred Levins, [place], MD 12/01/21 [phone]

## 2022-03-06 ENCOUNTER — Emergency Department: Payer: Managed Care, Other (non HMO)

## 2022-03-06 ENCOUNTER — Other Ambulatory Visit: Payer: Self-pay

## 2022-03-06 ENCOUNTER — Emergency Department
Admission: EM | Admit: 2022-03-06 | Discharge: 2022-03-06 | Discharge status: Against Medical Advice | Payer: Managed Care, Other (non HMO) | Source: Home / Self Care

## 2022-03-06 ENCOUNTER — Emergency Department
Admission: EM | Admit: 2022-03-06 | Discharge: 2022-03-06 | Disposition: A | Discharge status: Home / Self Care | Payer: Managed Care, Other (non HMO) | Attending: Emergency Medicine | Admitting: Emergency Medicine

## 2022-03-06 ENCOUNTER — Encounter: Payer: Self-pay | Admitting: Emergency Medicine

## 2022-03-06 DIAGNOSIS — I10 Essential (primary) hypertension: Secondary | ICD-10-CM | POA: Diagnosis not present

## 2022-03-06 DIAGNOSIS — Z5321 Procedure and treatment not carried out due to patient leaving prior to being seen by health care provider: Secondary | ICD-10-CM | POA: Insufficient documentation

## 2022-03-06 DIAGNOSIS — R7401 Elevation of levels of liver transaminase levels: Secondary | ICD-10-CM | POA: Diagnosis not present

## 2022-03-06 DIAGNOSIS — S0990XA Unspecified injury of head, initial encounter: Secondary | ICD-10-CM

## 2022-03-06 DIAGNOSIS — F10920 Alcohol use, unspecified with intoxication, uncomplicated: Secondary | ICD-10-CM

## 2022-03-06 DIAGNOSIS — R55 Syncope and collapse: Secondary | ICD-10-CM | POA: Diagnosis not present

## 2022-03-06 DIAGNOSIS — R519 Headache, unspecified: Secondary | ICD-10-CM | POA: Insufficient documentation

## 2022-03-06 DIAGNOSIS — W228XXA Striking against or struck by other objects, initial encounter: Secondary | ICD-10-CM | POA: Insufficient documentation

## 2022-03-06 DIAGNOSIS — F10129 Alcohol abuse with intoxication, unspecified: Secondary | ICD-10-CM | POA: Insufficient documentation

## 2022-03-06 DIAGNOSIS — S0001XA Abrasion of scalp, initial encounter: Secondary | ICD-10-CM | POA: Insufficient documentation

## 2022-03-06 DIAGNOSIS — Z79899 Other long term (current) drug therapy: Secondary | ICD-10-CM | POA: Diagnosis not present

## 2022-03-06 DIAGNOSIS — R42 Dizziness and giddiness: Secondary | ICD-10-CM | POA: Insufficient documentation

## 2022-03-06 DIAGNOSIS — Y908 Blood alcohol level of 240 mg/100 ml or more: Secondary | ICD-10-CM | POA: Insufficient documentation

## 2022-03-06 DIAGNOSIS — Y9389 Activity, other specified: Secondary | ICD-10-CM | POA: Insufficient documentation

## 2022-03-06 LAB — URINALYSIS, ROUTINE W REFLEX MICROSCOPIC
Bacteria, UA: NONE SEEN
Bilirubin Urine: NEGATIVE
Glucose, UA: NEGATIVE mg/dL
Hgb urine dipstick: NEGATIVE
Ketones, ur: NEGATIVE mg/dL
Leukocytes,Ua: NEGATIVE
Nitrite: NEGATIVE
Protein, ur: NEGATIVE mg/dL
Specific Gravity, Urine: 1.008 (ref 1.005–1.030)
Squamous Epithelial / HPF: NONE SEEN (ref 0–5)
pH: 7 (ref 5.0–8.0)

## 2022-03-06 LAB — BASIC METABOLIC PANEL
Anion gap: 12 (ref 5–15)
BUN: 7 mg/dL (ref 6–20)
CO2: 24 mmol/L (ref 22–32)
Calcium: 9.6 mg/dL (ref 8.9–10.3)
Chloride: 105 mmol/L (ref 98–111)
Creatinine, Ser: 0.79 mg/dL (ref 0.61–1.24)
GFR, Estimated: >60 mL/min (ref >60)
Glucose, Bld: 105 mg/dL — ABNORMAL HIGH (ref 70–99)
Potassium: 3.3 mmol/L — ABNORMAL LOW (ref 3.5–5.1)
Sodium: 141 mmol/L (ref 135–145)

## 2022-03-06 LAB — CBC
HCT: 35.9 % — ABNORMAL LOW (ref 39.0–52.0)
Hemoglobin: 12.1 g/dL — ABNORMAL LOW (ref 13.0–17.0)
MCH: 26.9 pg (ref 26.0–34.0)
MCHC: 33.7 g/dL (ref 30.0–36.0)
MCV: 79.8 fL — ABNORMAL LOW (ref 80.0–100.0)
Platelets: 255 10*3/uL (ref 150–400)
RBC: 4.5 MIL/uL (ref 4.22–5.81)
RDW: 14.3 % (ref 11.5–15.5)
WBC: 4.9 10*3/uL (ref 4.0–10.5)
nRBC: 0 % (ref 0.0–0.2)

## 2022-03-06 LAB — CBC WITH DIFFERENTIAL/PLATELET
Abs Immature Granulocytes: 0.01 10*3/uL (ref 0.00–0.07)
Basophils Absolute: 0 10*3/uL (ref 0.0–0.1)
Basophils Relative: 0 %
Eosinophils Absolute: 0 10*3/uL (ref 0.0–0.5)
Eosinophils Relative: 1 %
HCT: 36.6 % — ABNORMAL LOW (ref 39.0–52.0)
Hemoglobin: 12.2 g/dL — ABNORMAL LOW (ref 13.0–17.0)
Immature Granulocytes: 0 %
Lymphocytes Relative: 37 %
Lymphs Abs: 1.7 10*3/uL (ref 0.7–4.0)
MCH: 27.1 pg (ref 26.0–34.0)
MCHC: 33.3 g/dL (ref 30.0–36.0)
MCV: 81.2 fL (ref 80.0–100.0)
Monocytes Absolute: 0.6 10*3/uL (ref 0.1–1.0)
Monocytes Relative: 13 %
Neutro Abs: 2.2 10*3/uL (ref 1.7–7.7)
Neutrophils Relative %: 49 %
Platelets: 238 10*3/uL (ref 150–400)
RBC: 4.51 MIL/uL (ref 4.22–5.81)
RDW: 14.3 % (ref 11.5–15.5)
WBC: 4.5 10*3/uL (ref 4.0–10.5)
nRBC: 0 % (ref 0.0–0.2)

## 2022-03-06 LAB — COMPREHENSIVE METABOLIC PANEL
ALT: 71 U/L — ABNORMAL HIGH (ref 0–44)
AST: 87 U/L — ABNORMAL HIGH (ref 15–41)
Albumin: 4.4 g/dL (ref 3.5–5.0)
Alkaline Phosphatase: 36 U/L — ABNORMAL LOW (ref 38–126)
Anion gap: 10 (ref 5–15)
BUN: 11 mg/dL (ref 6–20)
CO2: 26 mmol/L (ref 22–32)
Calcium: 9.8 mg/dL (ref 8.9–10.3)
Chloride: 105 mmol/L (ref 98–111)
Creatinine, Ser: 0.75 mg/dL (ref 0.61–1.24)
GFR, Estimated: >60 mL/min (ref >60)
Glucose, Bld: 102 mg/dL — ABNORMAL HIGH (ref 70–99)
Potassium: 3.4 mmol/L — ABNORMAL LOW (ref 3.5–5.1)
Sodium: 141 mmol/L (ref 135–145)
Total Bilirubin: 0.6 mg/dL (ref 0.3–1.2)
Total Protein: 8 g/dL (ref 6.5–8.1)

## 2022-03-06 LAB — TROPONIN I (HIGH SENSITIVITY)
Troponin I (High Sensitivity): 8 ng/L (ref <18)
Troponin I (High Sensitivity): 7 ng/L (ref <18)

## 2022-03-06 LAB — URINE DRUG SCREEN, QUALITATIVE (ARMC ONLY)
Amphetamines, Ur Screen: NOT DETECTED
Barbiturates, Ur Screen: NOT DETECTED
Benzodiazepine, Ur Scrn: NOT DETECTED
Cannabinoid 50 Ng, Ur ~~LOC~~: NOT DETECTED
Cocaine Metabolite,Ur ~~LOC~~: NOT DETECTED
MDMA (Ecstasy)Ur Screen: NOT DETECTED
Methadone Scn, Ur: NOT DETECTED
Opiate, Ur Screen: NOT DETECTED
Phencyclidine (PCP) Ur S: NOT DETECTED
Tricyclic, Ur Screen: NOT DETECTED

## 2022-03-06 LAB — ETHANOL: Alcohol, Ethyl (B): 273 mg/dL — ABNORMAL HIGH (ref <10)

## 2022-03-06 MED ORDER — ACETAMINOPHEN 500 MG PO TABS
1000.0000 mg | ORAL_TABLET | Freq: Once | ORAL | Status: AC
  Administered 2022-03-06: 1000 mg via ORAL
  Filled 2022-03-06: qty 2

## 2022-03-06 MED ORDER — SODIUM CHLORIDE 0.9 % IV BOLUS (SEPSIS)
1000.0000 mL | Freq: Once | INTRAVENOUS | Status: AC
  Administered 2022-03-06: 1000 mL via INTRAVENOUS

## 2022-03-06 NOTE — Discharge Instructions (Signed)
You may alternate Tylenol 1000 mg every 6 hours as needed for pain, fever and Ibuprofen 800 mg every 6-8 hours as needed for pain, fever.  Please take Ibuprofen with food.  Do not take more than 4000 mg of Tylenol (acetaminophen) in a 24 hour period. ? ? ?I recommend close follow-up with your primary care doctor. ? ? ?Steps to find a Primary Care Provider (PCP): ? ?Call [phone] or [phone] to access "[place] Find a Doctor Service." ? ?2.  You may also go on the [facility] website at [email or web address] ? ?

## 2022-03-06 NOTE — ED Provider Notes (Addendum)
[facility] Provider Note    Event Date/Time   First MD Initiated Contact with Patient 03/06/22 [phone]     (approximate)   History   Loss of Consciousness   HPI  Christopher Hardin is a 41 y.o. male with history of hypertension who was brought into the emergency department with EMS after a syncopal event.  Patient states that he has been in his normal state of health.  He states tonight he was drinking 1 beer and several shots of liquor.  He was talking to his fiance and had been standing for about 10 minutes when he started feeling lightheaded and then passed out.  He did hit his head on the ground.  He is not on blood thinners.  Complaining of headache.  Denies any preceding chest pain, shortness of breath, headache, numbness, tingling or weakness.  No recent vomiting or diarrhea.  No bloody stools or melena.  Has had some lightheadedness with standing for several weeks.  He is on HCTZ but no other medications.  EMS reports blood sugar in the 100s.  He was hypertensive with them and not orthostatic.  Normal twelve-lead.  History provided by patient and EMS.    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 Authorizing Provider  cyclobenzaprine (FLEXERIL) 5 MG tablet Take 1 tablet (5 mg total) by mouth 3 (three) times daily as needed for muscle spasms. Patient not taking: Reported on 12/20/2019 10/02/19   Concha Se, MD  hydrochlorothiazide (HYDRODIURIL) 25 MG tablet Take 1 tablet (25 mg total) by mouth daily. 12/01/21   Nita Sickle, MD  losartan (COZAAR) 100 MG tablet Take 100 mg by mouth daily. 11/13/19   [provider]    Physical Exam   Triage Vital Signs: ED Triage Vitals  Enc Vitals Group     BP  03/06/22 0432 (!) 152/101     Pulse Rate 03/06/22 0432 76     Resp 03/06/22 0432 17     Temp 03/06/22 0432 98.7 F (37.1 C)     Temp Source 03/06/22 0432 Oral     SpO2 03/06/22 0432 99 %     Weight 03/06/22 0442 161 lb (73 kg)     Height 03/06/22 0442 6' (1.829 m)     Head Circumference --      Peak Flow --      Pain Score 03/06/22 0432 7     Pain Loc --      Pain Edu? --      Excl. in [place]? --     Most recent vital signs: Vitals:   03/06/22 0432 03/06/22 0605  BP: (!) 152/101 117/84  Pulse: 76 87  Resp: 17 20  Temp: 98.7 F (37.1 C)   SpO2: 99% 100%     CONSTITUTIONAL: Alert and oriented and responds appropriately to questions. Well-appearing; well-nourished; GCS 15 HEAD: Normocephalic; abrasions and tenderness to the posterior scalp EYES: Conjunctivae clear, PERRL, EOMI, no conjunctival pallor ENT: normal nose; no rhinorrhea; moist mucous membranes; pharynx without lesions noted; no dental injury; no septal hematoma, no epistaxis; no facial deformity or bony tenderness NECK: Supple, no midline spinal tenderness, step-off or deformity; trachea midline CARD: RRR; S1 and S2 appreciated; no  murmurs, no clicks, no rubs, no gallops RESP: Normal chest excursion without splinting or tachypnea; breath sounds clear and equal bilaterally; no wheezes, no rhonchi, no rales; no hypoxia or respiratory distress CHEST:  chest wall stable, no crepitus or ecchymosis or deformity, nontender to palpation; no flail chest ABD/GI: Normal bowel sounds; non-distended; soft, non-tender, no rebound, no guarding; no ecchymosis or other lesions noted PELVIS:  stable, nontender to palpation BACK:  The back appears normal; no midline spinal tenderness, step-off or deformity EXT: Normal ROM in all joints; non-tender to palpation; no edema; normal capillary refill; no cyanosis, no bony tenderness or bony deformity of patient's extremities, no joint effusion, compartments are soft, extremities are warm and  well-perfused, no ecchymosis; no calf tenderness or calf swelling SKIN: Normal color for age and race; warm NEURO: No facial asymmetry, normal speech, moving all extremities equally, normal sensation diffusely  ED Results / Procedures / Treatments   LABS: (all labs ordered are listed, but only abnormal results are displayed) Labs Reviewed  CBC WITH DIFFERENTIAL/PLATELET - Abnormal; Notable for the following components:      Result Value   Hemoglobin 12.2 (*)    HCT 36.6 (*)    All other components within normal limits  COMPREHENSIVE METABOLIC PANEL - Abnormal; Notable for the following components:   Potassium 3.4 (*)    Glucose, Bld 102 (*)    AST 87 (*)    ALT 71 (*)    Alkaline Phosphatase 36 (*)    All other components within normal limits  ETHANOL - Abnormal; Notable for the following components:   Alcohol, Ethyl (B) 273 (*)    All other components within normal limits  URINALYSIS, ROUTINE W REFLEX MICROSCOPIC - Abnormal; Notable for the following components:   Color, Urine STRAW (*)    APPearance CLEAR (*)    All other components within normal limits  URINE DRUG SCREEN, QUALITATIVE ([facility] ONLY)  TROPONIN I (HIGH SENSITIVITY)  TROPONIN I (HIGH SENSITIVITY)     EKG:  EKG Interpretation  Date/Time:  Friday Mar 06 2022 04:38:42 EDT Ventricular Rate:  76 PR Interval:  182 QRS Duration: 95 QT Interval:  395 QTC Calculation: 445 R Axis:   56 Text Interpretation: Sinus rhythm ST elev, probable normal early repol pattern Confirmed by Rochele Raring [phone]) on 03/06/2022 4:45:17 AM          RADIOLOGY: My personal review and interpretation of imaging: CT head shows no skull fracture or intracranial hemorrhage.  I have personally reviewed all radiology reports. CT HEAD WO CONTRAST ( )  Result Date: 03/06/2022 CLINICAL DATA:  Head trauma, moderate to severe. Headache. Syncope. EXAM: CT HEAD WITHOUT CONTRAST TECHNIQUE: Contiguous axial images were obtained from the  base of the skull through the vertex without intravenous contrast. RADIATION DOSE REDUCTION: This exam was performed according to the departmental dose-optimization program which includes automated exposure control, adjustment of the mA and/or kV according to patient size and/or use of iterative reconstruction technique. COMPARISON:  None Available. FINDINGS: Brain: No evidence of acute infarction, hemorrhage, hydrocephalus, extra-axial collection or mass lesion/mass effect. Vascular: No hyperdense vessel or unexpected calcification. Skull: Normal. Negative for fracture or focal lesion. Sinuses/Orbits: No acute finding. IMPRESSION: Negative head CT. Electronically Signed   By: Tiburcio Pea M.D.   On: 03/06/2022 05:10     PROCEDURES:  Critical Care performed: No     .1-3 Lead EKG Interpretation Performed by: Aurielle Slingerland, Layla Maw, DO Authorized by: Rett Stehlik, Layla Maw, DO  Interpretation: normal     ECG rate:  76   ECG rate assessment: normal     Rhythm: sinus rhythm     Ectopy: none     Conduction: normal      IMPRESSION / MDM / ASSESSMENT AND PLAN / ED COURSE  I reviewed the triage vital signs and the nursing notes.  Patient here with lightheadedness and a syncopal event resulting in a head injury.  The patient is on the cardiac monitor to evaluate for evidence of arrhythmia and/or significant heart rate changes.   DIFFERENTIAL DIAGNOSIS (includes but not limited to):   Dehydration, orthostasis, intoxication, anemia, electrolyte derangement, ACS, PE, dissection, arrhythmia, skull fracture, intracranial hemorrhage, concussion   PLAN: We will obtain CBC, CMP, ethanol level, EKG, troponin, CT head.  Will give IV fluids.   MEDICATIONS GIVEN IN ED: Medications  sodium chloride 0.9 % bolus 1,000 mL (0 mLs Intravenous Stopped 03/06/22 0607)  acetaminophen (TYLENOL) tablet 1,000 mg (1,000 mg Oral Given 03/06/22 0514)     ED COURSE: Patient's labs show no significant anemia.  Normal  electrolytes, renal function.  He has slightly elevated AST and ALT.  I suspect that he drinks more than he lets onto.  He has no abdominal pain.  His alcohol level today is 273.  Troponin is negative.  I do not feel he needs a repeat troponin given he is not having chest pain or shortness of breath.  Urine shows no ketonuria.  Drug screen negative.  CT of the head reviewed and interpreted by myself radiology and shows no acute traumatic injury.  He states he is feeling better.  He continues to run slightly hypertensive here.  Have advised him to follow-up closely with his primary care doctor.   At this time, I do not feel there is any life-threatening condition present. I reviewed all nursing notes, vitals, pertinent previous records.  All lab and urine results, EKGs, imaging ordered have been independently reviewed and interpreted by myself.  I reviewed all available radiology reports from any imaging ordered this visit.  Based on my assessment, I feel the patient is safe to be discharged home without further emergent workup and can continue workup as an outpatient as needed. Discussed all findings, treatment plan as well as usual and customary return precautions with patient and family.  They verbalize understanding and are comfortable with this plan.  Outpatient follow-up has been provided as needed.  All questions have been answered.    CONSULTS: Admission considered but given reassuring work-up, patient discharged home with close outpatient follow-up.   OUTSIDE RECORDS REVIEWED: Reviewed patient's last office visit with Gwenith Spitz with nephrology at [facility] on 02/19/2022.        FINAL CLINICAL IMPRESSION(S) / ED DIAGNOSES   Final diagnoses:  Syncope, unspecified syncope type  Injury of head, initial encounter  Alcoholic intoxication without complication (HCC)     Rx / DC Orders   ED Discharge Orders     None        Note:  This document was prepared using Dragon voice  recognition software and may include unintentional dictation errors.   Dimple Bastyr, Layla Maw, DO 03/06/22 0559    Adelynn Gipe, Layla Maw, DO 03/06/22 [phone]

## 2022-03-06 NOTE — ED Notes (Signed)
Patient transported to CT 

## 2022-03-06 NOTE — ED Triage Notes (Signed)
Presents to ED with [facility] for syncopal episode while standing, at home prior to arrival. Reports headache. Reports occasional episodes of lightheadedness, but no prior syncopal events. Denies chest pain, SOB, numbness, tingling. A/O x4 on arrival. Reports 3 shots of liquor and part of a beer tonight.  ?

## 2022-03-06 NOTE — ED Triage Notes (Signed)
Pt presents via EMS with complaints of LOC after consuming "enough" ETOH. Pt was just seen here this AM for similar sx including dizziness. Pt endorses a headache on the left side of his head. Denies CP or SOB. ?

## 2022-08-08 ENCOUNTER — Encounter: Payer: Self-pay | Admitting: Emergency Medicine

## 2022-08-08 ENCOUNTER — Other Ambulatory Visit: Payer: Self-pay

## 2022-08-08 ENCOUNTER — Emergency Department
Admission: EM | Admit: 2022-08-08 | Discharge: 2022-08-09 | Disposition: A | Discharge status: Home / Self Care | Payer: Managed Care, Other (non HMO) | Attending: Emergency Medicine | Admitting: Emergency Medicine

## 2022-08-08 DIAGNOSIS — Z046 Encounter for general psychiatric examination, requested by authority: Secondary | ICD-10-CM | POA: Diagnosis not present

## 2022-08-08 DIAGNOSIS — R45851 Suicidal ideations: Secondary | ICD-10-CM

## 2022-08-08 DIAGNOSIS — F1024 Alcohol dependence with alcohol-induced mood disorder: Secondary | ICD-10-CM | POA: Insufficient documentation

## 2022-08-08 DIAGNOSIS — E876 Hypokalemia: Secondary | ICD-10-CM | POA: Diagnosis not present

## 2022-08-08 DIAGNOSIS — F10220 Alcohol dependence with intoxication, uncomplicated: Secondary | ICD-10-CM | POA: Insufficient documentation

## 2022-08-08 DIAGNOSIS — F1094 Alcohol use, unspecified with alcohol-induced mood disorder: Secondary | ICD-10-CM | POA: Diagnosis not present

## 2022-08-08 DIAGNOSIS — Z79899 Other long term (current) drug therapy: Secondary | ICD-10-CM | POA: Diagnosis not present

## 2022-08-08 DIAGNOSIS — I10 Essential (primary) hypertension: Secondary | ICD-10-CM | POA: Insufficient documentation

## 2022-08-08 DIAGNOSIS — F10920 Alcohol use, unspecified with intoxication, uncomplicated: Secondary | ICD-10-CM

## 2022-08-08 DIAGNOSIS — Y908 Blood alcohol level of 240 mg/100 ml or more: Secondary | ICD-10-CM | POA: Insufficient documentation

## 2022-08-08 LAB — COMPREHENSIVE METABOLIC PANEL
ALT: 111 U/L — ABNORMAL HIGH (ref 0–44)
AST: 182 U/L — ABNORMAL HIGH (ref 15–41)
Albumin: 5 g/dL (ref 3.5–5.0)
Alkaline Phosphatase: 36 U/L — ABNORMAL LOW (ref 38–126)
Anion gap: 16 — ABNORMAL HIGH (ref 5–15)
BUN: 15 mg/dL (ref 6–20)
CO2: 23 mmol/L (ref 22–32)
Calcium: 10 mg/dL (ref 8.9–10.3)
Chloride: 99 mmol/L (ref 98–111)
Creatinine, Ser: 0.96 mg/dL (ref 0.61–1.24)
GFR, Estimated: >60 mL/min (ref >60)
Glucose, Bld: 100 mg/dL — ABNORMAL HIGH (ref 70–99)
Potassium: 3.3 mmol/L — ABNORMAL LOW (ref 3.5–5.1)
Sodium: 138 mmol/L (ref 135–145)
Total Bilirubin: 0.7 mg/dL (ref 0.3–1.2)
Total Protein: 9 g/dL — ABNORMAL HIGH (ref 6.5–8.1)

## 2022-08-08 LAB — URINE DRUG SCREEN, QUALITATIVE (ARMC ONLY)
Amphetamines, Ur Screen: NOT DETECTED
Barbiturates, Ur Screen: NOT DETECTED
Benzodiazepine, Ur Scrn: NOT DETECTED
Cannabinoid 50 Ng, Ur ~~LOC~~: NOT DETECTED
Cocaine Metabolite,Ur ~~LOC~~: NOT DETECTED
MDMA (Ecstasy)Ur Screen: NOT DETECTED
Methadone Scn, Ur: NOT DETECTED
Opiate, Ur Screen: NOT DETECTED
Phencyclidine (PCP) Ur S: NOT DETECTED
Tricyclic, Ur Screen: NOT DETECTED

## 2022-08-08 LAB — SALICYLATE LEVEL: Salicylate Lvl: <7 mg/dL — ABNORMAL LOW (ref 7.0–30.0)

## 2022-08-08 LAB — CBC
HCT: 37.5 % — ABNORMAL LOW (ref 39.0–52.0)
Hemoglobin: 12.8 g/dL — ABNORMAL LOW (ref 13.0–17.0)
MCH: 27.2 pg (ref 26.0–34.0)
MCHC: 34.1 g/dL (ref 30.0–36.0)
MCV: 79.6 fL — ABNORMAL LOW (ref 80.0–100.0)
Platelets: 247 10*3/uL (ref 150–400)
RBC: 4.71 MIL/uL (ref 4.22–5.81)
RDW: 13.8 % (ref 11.5–15.5)
WBC: 5 10*3/uL (ref 4.0–10.5)
nRBC: 0 % (ref 0.0–0.2)

## 2022-08-08 LAB — ETHANOL: Alcohol, Ethyl (B): 386 mg/dL (ref <10)

## 2022-08-08 LAB — ACETAMINOPHEN LEVEL: Acetaminophen (Tylenol), Serum: <10 ug/mL — ABNORMAL LOW (ref 10–30)

## 2022-08-08 MED ORDER — LORAZEPAM 2 MG PO TABS: 0.0000 mg | ORAL_TABLET | Freq: Four times a day (QID) | ORAL | Status: DC

## 2022-08-08 MED ORDER — THIAMINE HCL 100 MG PO TABS
100.0000 mg | ORAL_TABLET | Freq: Every day | ORAL | Status: DC
  Filled 2022-08-08: qty 1

## 2022-08-08 MED ORDER — POTASSIUM CHLORIDE CRYS ER 20 MEQ PO TBCR
40.0000 meq | EXTENDED_RELEASE_TABLET | Freq: Once | ORAL | Status: AC
  Administered 2022-08-09: 40 meq via ORAL
  Filled 2022-08-08: qty 2

## 2022-08-08 NOTE — ED Triage Notes (Signed)
Pt presents to ER accompanied by [facility] who gave pt a ride to hospital.  Pt reports his mother called because he has expressed that he has thoughts about hurting himself. Pt denies any HI, Pt denies any visual or auditory hallucinations. Pt drinks alcohols every day. Pt reports hi drinks a 40 ounce beer a day. Pt denies any drug use. Pt tearful in triage. Pt talks in complete sentences no respiratory distress noted

## 2022-08-08 NOTE — ED Provider Notes (Signed)
[facility] Provider Note    Event Date/Time   First MD Initiated Contact with Patient 08/08/22 2304     (approximate)   History   Psychiatric Evaluation  Level V caveat: Limited by patient soundly sleeping  HPI  Christopher [place] is a 41 y.o. male who presents to the ED accompanied by [employer] originally voluntary for behavioral medicine evaluation.  Patient is an alcoholic.  He was IVC by the previous provider for leaving the premises after stating he was going to kill himself.  Patient currently sleeping soundly so rest of history is limited.     Past Medical History   Past Medical History:  Diagnosis Date   Hypertension      Active Problem List  There are no problems to display for this patient.    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 Authorizing Provider  cyclobenzaprine (FLEXERIL) 5 MG tablet Take 1 tablet (5 mg total) by mouth 3 (three) times daily as needed for muscle spasms. Patient not taking: Reported on 12/20/2019 10/02/19   Concha Se, MD  hydrochlorothiazide (HYDRODIURIL) 25 MG tablet Take 1 tablet (25 mg total) by mouth daily. 12/01/21   Nita Sickle, MD  losartan (COZAAR) 100 MG tablet Take 100 mg by mouth daily. 11/13/19   [provider]     Allergies  Patient has no known allergies.   Family History   Family History  Problem Relation Age of Onset   Bladder Cancer Neg Hx    Prostate cancer Neg Hx    Kidney cancer Neg Hx      Physical Exam  Triage Vital Signs: ED Triage Vitals  Enc Vitals Group     BP 08/08/22 2003 (!) 136/104     Pulse Rate 08/08/22 2003 (!) 101     Resp 08/08/22 2003 16     Temp 08/08/22 2003 98.6 F (37 C)     Temp Source 08/08/22 2003 Oral     SpO2 08/08/22  2003 93 %     Weight 08/08/22 2004 160 lb (72.6 kg)     Height 08/08/22 2004 6' (1.829 m)     Head Circumference --      Peak Flow --      Pain Score 08/08/22 2003 0     Pain Loc --      Pain Edu? --      Excl. in GC? --     Updated Vital Signs: BP (!) 136/104 (BP Location: Left Arm)   Pulse (!) 101   Temp 98.6 F (37 C) (Oral)   Resp 16   Ht 6' (1.829 m)   Wt 72.6 kg   SpO2 93%   BMI 21.70 kg/m    General: Asleep, no distress.  CV:  RRR.  Good peripheral perfusion.  Resp:  Normal effort.  CTAB. Abd:  No distention.  Other:  Not agitated   ED Results / Procedures / Treatments  Labs (all labs ordered are listed, but only abnormal results are displayed) Labs Reviewed  COMPREHENSIVE METABOLIC PANEL - Abnormal; Notable for the following components:      Result Value   Potassium 3.3 (*)    Glucose, Bld 100 (*)    Total Protein 9.0 (*)  AST 182 (*)    ALT 111 (*)    Alkaline Phosphatase 36 (*)    Anion gap 16 (*)    All other components within normal limits  ETHANOL - Abnormal; Notable for the following components:   Alcohol, Ethyl (B) 386 (*)    All other components within normal limits  SALICYLATE LEVEL - Abnormal; Notable for the following components:   Salicylate Lvl <9.3 (*)    All other components within normal limits  ACETAMINOPHEN LEVEL - Abnormal; Notable for the following components:   Acetaminophen (Tylenol), Serum <10 (*)    All other components within normal limits  CBC - Abnormal; Notable for the following components:   Hemoglobin 12.8 (*)    HCT 37.5 (*)    MCV 79.6 (*)    All other components within normal limits  URINE DRUG SCREEN, QUALITATIVE ([facility] ONLY)     EKG  None   RADIOLOGY None   Official radiology report(s): No results found.   PROCEDURES:  Critical Care performed: No  Procedures   MEDICATIONS ORDERED IN ED: Medications  LORazepam (ATIVAN) tablet 0-4 mg (has no administration in time range)  thiamine (VITAMIN  B1) tablet 100 mg (has no administration in time range)  potassium chloride SA (KLOR-CON M) CR tablet 40 mEq (has no administration in time range)     IMPRESSION / MDM / ASSESSMENT AND PLAN / ED COURSE  I reviewed the triage vital signs and the nursing notes.                             41 year old male presenting with alcohol intoxication and depression with suicidal thoughts.  EtOH is elevated at 386; have placed him on CIWA.  Mild hypokalemia potassium 3.3; will replete once patient is awake.  Elevated transaminases secondary to alcohol use.  UDS is negative. The patient has been placed in psychiatric observation due to the need to provide a safe environment for the patient while obtaining psychiatric consultation and evaluation, as well as ongoing medical and medication management to treat the patient's condition.  The patient has been placed under full IVC at this time.   Patient's presentation is most consistent with exacerbation of chronic illness.   Clinical Course as of 08/08/22 8101  Sat Aug 08, 2022  2202 Patient is a voluntary commitment initially.  Patient arrived with a report of suicidal ideations at home.  Mother called law enforcement and they brought the patient though there was no IVC taken out with a mattress in his office.  Patient was sitting in triage when he notified me that " I am not waiting any longer, I should have killed myself like I said earlier.  I'm gonna go outside and kill myself." Patient was not under IVC and left before MD could fill out IVC papers. Security found patient wandering in the parking lot and brought him back into the ED. Attending provider aware patient came back and will fill out IVC paperwork at this time [JC]    Clinical Course User Index [JC] Cuthriell, Charline Bills, PA-C     FINAL CLINICAL IMPRESSION(S) / ED DIAGNOSES   Final diagnoses:  Suicidal ideation  Alcoholic intoxication without complication ([place])  Hypokalemia     Rx / DC  Orders   ED Discharge Orders     None        Note:  This document was prepared using Dragon voice recognition software and may include  unintentional dictation errors.

## 2022-08-08 NOTE — ED Notes (Signed)
IVC 

## 2022-08-08 NOTE — ED Notes (Signed)
Pt belongings in one pt's belongings bag:  Gray pants, gray jacket, white shoes, black socks, black and white underwear, white shirt, 1 ring

## 2022-08-08 NOTE — ED Notes (Signed)
Pt returned to ED by [facility] security. Pt wearing street clothes at this time. [employer] also in lobby but leaves stating " yall got him back so you dont really need [place]." Pt placed back in triage middle with security at this time.

## 2022-08-08 NOTE — ED Notes (Signed)
[employer] called at this time to report pt leaving ED with making statements of harming self. Report and description of pt given and per [employer] dispatch officers will " head that way."

## 2022-08-08 NOTE — ED Notes (Signed)
Patient reports being under a lot of family stressors, recently had a close family member pass away and that his family depends on him a lot.  Reports that he was having some vague thoughts about wanting to hurt himself.

## 2022-08-09 DIAGNOSIS — F1094 Alcohol use, unspecified with alcohol-induced mood disorder: Secondary | ICD-10-CM | POA: Diagnosis not present

## 2022-08-09 NOTE — Consult Note (Signed)
[facility]   Reason for Consult:  IVC, SI Referring Physician:  EDP Patient Identification: Christopher [place] MRN:  [id number] Principal Diagnosis: Alcohol-induced mood disorder (HCC) Diagnosis:  Principal Problem:   Alcohol-induced mood disorder (HCC)   Total Time spent with patient: 20 minutes  Subjective:   Christopher [place] is a 41 y.o. 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   Upon assessment, pt is awake, alert x 4, in no resp distress, calm, cooperative, answering questions appropriately; pt states that he has been under a lot of "stressors--aunt just died" and he feels that this has affected him greatly. He states that he lives with his fiance, Christopher [place] and feels safe going home. He verbalizes remorse for scratching himself; There is a small scratch to his L-mid abd, skin intact; pt denies suicidal/homicidal ideations at this time, as well as denies A/V hallucinations. States that he generally sleeps well and his appetite has been normal. . When asked if his drinking is an issue/problem for him, his reply is "no"; offered rehab/detox, pt declines at this time.  No psych history.  Collateral information received from both Christopher HillMercedes, pt fiance and pts mother: [place]: states that she feels safe having the pt come home, denies having any guns in the home  Pt mother: she expresses concern for her son and  his drinking, is looking into getting [employer] for him and feels safe for him to come home.    HPI:   Christopher [place] is a 41 y.o. male who presented to the ED accompanied by [employer] originally voluntary for behavioral medicine evaluation.  Patient is an alcoholic.  He was IVC by the previous provider for leaving the premises after stating he was going to kill himself.      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   Family History:  Family History  Problem Relation Age of Onset   Bladder Cancer Neg Hx    Prostate cancer Neg Hx    Kidney cancer Neg Hx    Family Psychiatric  History: unk Social History:  Social History   Substance and Sexual Activity  Alcohol Use Yes   Alcohol/week: 1.0 standard drink of alcohol   Types: 1 Cans of beer per week     Social History   Substance and Sexual Activity  Drug Use No    Social History   Socioeconomic History   Marital status: Single    Spouse name: Not on file   Number of children: Not on file  Years of education: Not on file   Highest education level: Not on file  Occupational History   Not on file  Tobacco Use   Smoking status: Every Day    Packs/day: 0.25    Types: Cigarettes   Smokeless tobacco: Never  Substance and Sexual Activity   Alcohol use: Yes    Alcohol/week: 1.0 standard drink of alcohol    Types: 1 Cans of beer per week   Drug use: No   Sexual activity: Not on file  Other Topics Concern   Not on file  Social History Narrative   Not on file   Social Determinants of Health   Financial Resource Strain: Not on file  Food Insecurity: Not on file  Transportation Needs: Not on file   Physical Activity: Not on file  Stress: Not on file  Social Connections: Not on file   Additional Social History:    Allergies:  No Known Allergies  Labs:  Results for orders placed or performed during the hospital encounter of 08/08/22 (from the past 48 hour(s))  Comprehensive metabolic panel     Status: Abnormal   Collection Time: 08/08/22  8:16 PM  Result Value Ref Range   Sodium 138 135 - 145 mmol/L   Potassium 3.3 (L) 3.5 - 5.1 mmol/L   Chloride 99 98 - 111 mmol/L   CO2 23 22 - 32 mmol/L   Glucose, Bld 100 (H) 70 - 99 mg/dL    Comment: Glucose reference range applies only to samples taken after fasting for at least 8 hours.   BUN 15 6 - 20 mg/dL   Creatinine, Ser 0.96 0.61 - 1.24 mg/dL   Calcium 10.0 8.9 - 10.3 mg/dL   Total Protein 9.0 (H) 6.5 - 8.1 g/dL   Albumin 5.0 3.5 - 5.0 g/dL   AST 182 (H) 15 - 41 U/L   ALT 111 (H) 0 - 44 U/L   Alkaline Phosphatase 36 (L) 38 - 126 U/L   Total Bilirubin 0.7 0.3 - 1.2 mg/dL   GFR, Estimated >60 >60 mL/min    Comment: (NOTE) Calculated using the CKD-EPI Creatinine Equation (2021)    Anion gap 16 (H) 5 - 15    Comment: Performed at [facility], [address] [place], [place] [address]  Ethanol     Status: Abnormal   Collection Time: 08/08/22  8:16 PM  Result Value Ref Range   Alcohol, Ethyl (B) 386 (HH) <10 mg/dL    Comment: CRITICAL RESULT CALLED TO, READ BACK BY AND VERIFIED WITH Rustyn Grandfield AT 2046 08/08/2022 DLB (NOTE) Lowest detectable limit for serum alcohol is 10 mg/dL.  For medical purposes only. Performed at [facility], [address] [place], [place] [address]   Salicylate level     Status: Abnormal   Collection Time: 08/08/22  8:16 PM  Result Value Ref Range   Salicylate Lvl <2.9 (L) 7.0 - 30.0 mg/dL    Comment: Performed at [facility], [address] [place], [place] [address]  Acetaminophen level     Status: Abnormal   Collection Time: 08/08/22  8:16 PM  Result  Value Ref Range   Acetaminophen (Tylenol), Serum <10 (L) 10 - 30 ug/mL    Comment: (NOTE) Therapeutic concentrations vary significantly. A range of 10-30 ug/mL  may be an effective concentration for many patients. However, some  are best treated at concentrations outside of this range. Acetaminophen concentrations >150 ug/mL at 4 hours after ingestion  and >50 ug/mL  at 12 hours after ingestion are often associated with  toxic reactions.  Performed at [facility], [address] [place], [place] [address]   cbc     Status: Abnormal   Collection Time: 08/08/22  8:16 PM  Result Value Ref Range   WBC 5.0 4.0 - 10.5 K/uL   RBC 4.71 4.22 - 5.81 MIL/uL   Hemoglobin 12.8 (L) 13.0 - 17.0 g/dL   HCT 23.5 (L) 57.3 - 22.0 %   MCV 79.6 (L) 80.0 - 100.0 fL   MCH 27.2 26.0 - 34.0 pg   MCHC 34.1 30.0 - 36.0 g/dL   RDW 25.4 27.0 - 62.3 %   Platelets 247 150 - 400 K/uL   nRBC 0.0 0.0 - 0.2 %    Comment: Performed at [facility], [address] [place], [place] [address]  Urine Drug Screen, Qualitative     Status: None   Collection Time: 08/08/22  8:16 PM  Result Value Ref Range   Tricyclic, Ur Screen NONE DETECTED NONE DETECTED   Amphetamines, Ur Screen NONE DETECTED NONE DETECTED   MDMA (Ecstasy)Ur Screen NONE DETECTED NONE DETECTED   Cocaine Metabolite,Ur [place] NONE DETECTED NONE DETECTED   Opiate, Ur Screen NONE DETECTED NONE DETECTED   Phencyclidine (PCP) Ur S NONE DETECTED NONE DETECTED   Cannabinoid 50 Ng, Ur [place] NONE DETECTED NONE DETECTED   Barbiturates, Ur Screen NONE DETECTED NONE DETECTED   Benzodiazepine, Ur Scrn NONE DETECTED NONE DETECTED   Methadone Scn, Ur NONE DETECTED NONE DETECTED    Comment: (NOTE) Tricyclics + metabolites, urine    Cutoff 1000 ng/mL Amphetamines + metabolites, urine  Cutoff 1000 ng/mL MDMA (Ecstasy), urine              Cutoff 500 ng/mL Cocaine Metabolite, urine          Cutoff 300 ng/mL Opiate + metabolites, urine        Cutoff 300  ng/mL Phencyclidine (PCP), urine         Cutoff 25 ng/mL Cannabinoid, urine                 Cutoff 50 ng/mL Barbiturates + metabolites, urine  Cutoff 200 ng/mL Benzodiazepine, urine              Cutoff 200 ng/mL Methadone, urine                   Cutoff 300 ng/mL  The urine drug screen provides only a preliminary, unconfirmed analytical test result and should not be used for non-medical purposes. Clinical consideration and professional judgment should be applied to any positive drug screen result due to possible interfering substances. A more specific alternate chemical method must be used in order to obtain a confirmed analytical result. Gas chromatography / mass spectrometry (GC/MS) is the preferred confirm atory method. Performed at [facility], [address] [place], [place] [address]     Current Facility-Administered Medications  Medication Dose Route Frequency Provider Last Rate Last Admin   LORazepam (ATIVAN) tablet 0-4 mg  0-4 mg Oral Q6H Irean Hong, MD       thiamine (VITAMIN B1) tablet 100 mg  100 mg Oral Daily Irean Hong, MD       Current Outpatient Medications  Medication Sig Dispense Refill   cyclobenzaprine (FLEXERIL) 5 MG tablet Take 1 tablet (5 mg total) by mouth 3 (three) times daily as needed for muscle spasms. (Patient not taking: Reported on 12/20/2019) 15 tablet  0   hydrochlorothiazide (HYDRODIURIL) 25 MG tablet Take 1 tablet (25 mg total) by mouth daily. 30 tablet 1   losartan (COZAAR) 100 MG tablet Take 100 mg by mouth daily.      Musculoskeletal: Strength & Muscle Tone: within normal limits Gait & Station: normal Patient leans: N/A  Psychiatric Specialty Exam: Physical Exam Vitals and nursing note reviewed.  Constitutional:      Appearance: Normal appearance.  HENT:     Head: Normocephalic.     Nose: Nose normal.  Pulmonary:     Effort: Pulmonary effort is normal.  Musculoskeletal:        General: Normal range of motion.      Cervical back: Normal range of motion.  Neurological:     General: No focal deficit present.     Mental Status: He is oriented to person, place, and time.  Psychiatric:        Attention and Perception: Attention and perception normal.        Mood and Affect: Mood is anxious and depressed.        Speech: Speech normal.        Behavior: Behavior normal. Behavior is cooperative.        Thought Content: Thought content normal.        Cognition and Memory: Cognition and memory normal.        Judgment: Judgment normal.     Review of Systems  Psychiatric/Behavioral:  Positive for depression and substance abuse. The patient is nervous/anxious.   All other systems reviewed and are negative.   Blood pressure (!) 132/96, pulse 81, temperature 98.5 F (36.9 C), temperature source Oral, resp. rate 17, height 6' (1.829 m), weight 72.6 kg, SpO2 97 %.Body mass index is 21.7 kg/m.  General Appearance: Casual and Well Groomed  Eye Contact:  Good  Speech:  Clear and Coherent  Volume:  Normal  Mood:  anxious and depression, mild  Affect:  Appropriate  Thought Process:  Coherent; goal directed  Orientation:  Full (Time, Place, and Person)  Thought Content:  Logical  Suicidal Thoughts:  No  Homicidal Thoughts:  No  Memory:  Immediate;   Good Recent;   Good Remote;   Good  Judgement:  Fair  Insight:  Fair  Psychomotor Activity:  Normal  Concentration:  Concentration: Good and Attention Span: Good  Recall:  Good  Fund of Knowledge:  Good  Language:  Good  Akathisia:  Negative  Handed:    AIMS (if indicated):     Assets:  Communication Skills Desire for Improvement Social Support Vocational/Educational  ADL's:  Intact  Cognition:  WNL  Sleep:        Physical Exam: Physical Exam Vitals and nursing note reviewed.  Constitutional:      Appearance: Normal appearance.  HENT:     Head: Normocephalic.     Nose: Nose normal.  Pulmonary:     Effort: Pulmonary effort is normal.   Musculoskeletal:        General: Normal range of motion.     Cervical back: Normal range of motion.  Neurological:     General: No focal deficit present.     Mental Status: He is oriented to person, place, and time.  Psychiatric:        Attention and Perception: Attention and perception normal.        Mood and Affect: Mood is anxious and depressed.        Speech: Speech normal.  Behavior: Behavior normal. Behavior is cooperative.        Thought Content: Thought content normal.        Cognition and Memory: Cognition and memory normal.        Judgment: Judgment normal.    Review of Systems  Psychiatric/Behavioral:  Positive for depression and substance abuse. The patient is nervous/anxious.   All other systems reviewed and are negative.  Blood pressure (!) 132/96, pulse 81, temperature 98.5 F (36.9 C), temperature source Oral, resp. rate 17, height 6' (1.829 m), weight 72.6 kg, SpO2 97 %. Body mass index is 21.7 kg/m.  Treatment Plan Summary: Alcohol-Induced Mood Disorder: -Resources given to pt for outpatient detox/rehab -Encouraged pt to decrease drinking, seek out community assistance -Outpatient Therapy recommended -988 utilization  Disposition:  Home No evidence of imminent risk to self or others at present.   Patient does not meet criteria for psychiatric inpatient admission. Supportive therapy provided about ongoing stressors. Discussed crisis plan, support from social network, calling 911, coming to the Emergency Department, and calling Suicide Hotline.  Nanine Means, NP 08/09/2022 10:53 AM

## 2022-08-09 NOTE — ED Provider Notes (Signed)
-----------------------------------------   11:12 AM on 08/09/2022 -----------------------------------------  Patient has been seen and evaluated by psychiatry.  They believe the patient is safe for discharge from a psychiatric standpoint and have canceled his IVC.  From a medical standpoint patient is quite intoxicated however he has been in the emergency department over 15 hours.  He is now awake alert and cooperative.  Remainder of the medical work-up largely nonrevealing.  Normal CBC, negative urine drug screen, negative acetaminophen and salicylate level.  The patient will be discharged home with outpatient resources.   Harvest Dark, MD 08/09/22 1113

## 2022-08-09 NOTE — ED Notes (Signed)
Pt calling for ride home. EDT Mayra getting pt's belongings at this time.

## 2022-08-09 NOTE — ED Notes (Signed)
TTS speaking with patient. 

## 2022-08-09 NOTE — ED Notes (Signed)
Pt given breakfast tray

## 2022-08-09 NOTE — Discharge Instructions (Addendum)
You have been seen in the emergency department for a  psychiatric concern. You have been evaluated both medically as well as psychiatrically. Please follow-up with your outpatient resources provided. Return to the emergency department for any worsening symptoms, or any thoughts of hurting yourself or anyone else so that we may attempt to help you. 

## 2022-08-09 NOTE — ED Notes (Signed)
IVC/pending psych consult 

## 2022-08-09 NOTE — BH Assessment (Signed)
Comprehensive Clinical Assessment (CCA) Note  08/09/2022 Christopher Hardin [id number]  Chief Complaint: Patient is a 41 year old male presenting to [facility] ED under IVC. Per triage note Pt presents to ER accompanied by [facility] who gave pt a ride to hospital.  Pt reports his mother called because he has expressed that he has thoughts about hurting himself. Pt denies any HI, Pt denies any visual or auditory hallucinations. Pt drinks alcohols every day. Pt reports hi drinks a 40 ounce beer a day. Pt denies any drug use. Pt tearful in triage. Pt talks in complete sentences no respiratory distress noted. During assessment patient appears alert and oriented x4, calm and cooperative. Patient reports "I had an episode, I tried to stab myself at home due to stress and frustrations." Patient reports that this was his first attempt. Patient reports that he lives with his fiance and gets custody of his 2 children every other week. Patient also reports that he drinks alcohol but he reports drinking a 40oz beer tonight. Patient's BAL is 386. Patient denies SI/HI/AH/VH Chief Complaint  Patient presents with   Psychiatric Evaluation   Visit Diagnosis: Alcohol intoxication    CCA Screening, Triage and Referral (STR)  Patient Reported Information How did you hear about [place]? Legal System  Referral name: No data recorded Referral phone number: No data recorded  Whom do you see for routine medical problems? No data recorded Practice/Facility Name: No data recorded Practice/Facility Phone Number: No data recorded Name of Contact: No data recorded Contact Number: No data recorded Contact Fax Number: No data recorded Prescriber Name: No data recorded Prescriber Address (if known): No data recorded  What Is the Reason for Your Visit/Call Today? Pt presents to ER accompanied by [facility] who gave pt a ride to hospital.  Pt reports his mother called because he has expressed  that he has thoughts about hurting himself. Pt denies any HI, Pt denies any visual or auditory hallucinations. Pt drinks alcohols every day. Pt reports hi drinks a 40 ounce beer a day. Pt denies any drug use. Pt tearful in triage. Pt talks in complete sentences no respiratory distress noted  How Long Has This Been Causing You Problems? > than 6 months  What Do You Feel Would Help You the Most Today? No data recorded  Have You Recently Been in Any Inpatient Treatment (Hospital/Detox/Crisis Center/28-Day Program)? No data recorded Name/Location of Program/Hospital:No data recorded How Long Were You There? No data recorded When Were You Discharged? No data recorded  Have You Ever Received Services From [facility] Before? No data recorded Who Do You See at [facility]? No data recorded  Have You Recently Had Any Thoughts About Hurting Yourself? Yes  Are You Planning to Commit Suicide/Harm Yourself At This time? No   Have you Recently Had Thoughts About [place]? No  Explanation: No data recorded  Have You Used Any Alcohol or Drugs in the Past 24 Hours? Yes  How Long Ago Did You Use Drugs or Alcohol? No data recorded What Did You Use and How Much? Alcohol, 40oz beer   Do You Currently Have a Therapist/Psychiatrist? No  Name of Therapist/Psychiatrist: No data recorded  Have You Been Recently Discharged From Any Office Practice or Programs? No  Explanation of Discharge From Practice/Program: No data recorded    CCA Screening Triage Referral Assessment Type of Contact: Face-to-Face  Is this Initial or Reassessment? No data recorded Date Telepsych consult ordered in  CHL:  No data recorded Time Telepsych consult ordered in CHL:  No data recorded  Patient Reported Information Reviewed? No data recorded Patient Left Without Being Seen? No data recorded Reason for Not Completing Assessment: No data recorded  Collateral Involvement: No data recorded  Does Patient  Have a [place]? No data recorded Name and Contact of Legal Guardian: No data recorded If Minor and Not Living with Parent(s), Who has Custody? No data recorded Is CPS involved or ever been involved? Never  Is APS involved or ever been involved? Never   Patient Determined To Be At Risk for Harm To Self or Others Based on Review of Patient Reported Information or Presenting Complaint? No  Method: No data recorded Availability of Means: No data recorded Intent: No data recorded Notification Required: No data recorded Additional Information for Danger to Others Potential: No data recorded Additional Comments for Danger to Others Potential: No data recorded Are There Guns or Other Weapons in Your Home? No data recorded Types of Guns/Weapons: No data recorded Are These Weapons Safely Secured?                            No data recorded Who Could Verify You Are Able To Have These Secured: No data recorded Do You Have any Outstanding Charges, Pending Court Dates, Parole/Probation? No data recorded Contacted To Inform of Risk of Harm To Self or Others: No data recorded  Location of Assessment: [facility] ED   Does Patient Present under Involuntary Commitment? Yes  IVC Papers Initial File Date: 08/09/22   [place] of Residence: [place]   Patient Currently Receiving the Following Services: No data recorded  Determination of Need: Emergent (2 hours)   Options For Referral: No data recorded    CCA Biopsychosocial Intake/Chief Complaint:  No data recorded Current Symptoms/Problems: No data recorded  Patient Reported Schizophrenia/Schizoaffective Diagnosis in Past: No   Strengths: Patient is able to communicate his needs  Preferences: No data recorded Abilities: No data recorded  Type of Services Patient Feels are Needed: No data recorded  Initial Clinical Notes/Concerns: No data recorded  Mental Health Symptoms Depression:   Change in energy/activity;  Fatigue; Hopelessness; Worthlessness   Duration of Depressive symptoms:  Greater than two weeks   Mania:   None   Anxiety:    None   Psychosis:   None   Duration of Psychotic symptoms: No data recorded  Trauma:   None   Obsessions:   None   Compulsions:   None   Inattention:   None   Hyperactivity/Impulsivity:   None   Oppositional/Defiant Behaviors:   None   Emotional Irregularity:   None   Other Mood/Personality Symptoms:  No data recorded   Mental Status Exam Appearance and self-care  Stature:   Average   Weight:   Average weight   Clothing:   Casual   Grooming:   Normal   Cosmetic use:   None   Posture/gait:   Normal   Motor activity:   Not Remarkable   Sensorium  Attention:   Normal   Concentration:   Normal   Orientation:   X5   Recall/memory:   Normal   Affect and Mood  Affect:   Appropriate   Mood:   Depressed   Relating  Eye contact:   Normal   Facial expression:   Responsive   Attitude toward examiner:   Cooperative   Thought and Language  Speech flow:  Clear and Coherent   Thought content:   Appropriate to Mood and Circumstances   Preoccupation:   None   Hallucinations:   None   Organization:  No data recorded  [employer] of Knowledge:   Fair   Intelligence:   Average   Abstraction:   Functional   Judgement:   Impaired   Reality Testing:   Adequate   Insight:   Lacking   Decision Making:   Normal   Social Functioning  Social Maturity:   Responsible   Social Judgement:   Normal   Stress  Stressors:   Other (Comment)   Coping Ability:   Exhausted   Skill Deficits:   None   Supports:   Family     Religion: Religion/Spirituality Are You A Religious Person?: No  Leisure/Recreation: Leisure / Recreation Do You Have Hobbies?: No  Exercise/Diet: Exercise/Diet Do You Exercise?: No Have You Gained or Lost A Significant Amount of Weight in the  Past Six Months?: No Do You Follow a Special Diet?: No Do You Have Any Trouble Sleeping?: No   CCA Employment/Education Employment/Work Situation: Employment / Work Situation Employment Situation: Employed Work Stressors: None reported Patient's Job has Been Impacted by Current Illness: No Has Patient ever Been in Passenger transport manager?: No  Education: Education Is Patient Currently Attending School?: No Did You Have An Individualized Education Program (IIEP): No Did You Have Any Difficulty At [employer]?: No Patient's Education Has Been Impacted by Current Illness: No   CCA Family/Childhood History Family and Relationship History: Family history Marital status: Long term relationship Long term relationship, how long?: Unknown What types of issues is patient dealing with in the relationship?: Nont reported Additional relationship information: None reported Does patient have children?: Yes How many children?: 2 How is patient's relationship with their children?: Patient has a good relationship with his children  Childhood History:  Childhood History Did patient suffer any verbal/emotional/physical/sexual abuse as a child?: No Did patient suffer from severe childhood neglect?: No Has patient ever been sexually abused/assaulted/raped as an adolescent or adult?: No Was the patient ever a victim of a crime or a disaster?: No Witnessed domestic violence?: No Has patient been affected by domestic violence as an adult?: No  Child/Adolescent Assessment:     CCA Substance Use Alcohol/Drug Use: Alcohol / Drug Use Pain Medications: See MAR Prescriptions: See MAR Over the Counter: See MAR History of alcohol / drug use?: Yes Substance #1 Name of Substance 1: Alcohol 1 - Amount (size/oz): 40 oz beer 1 - Frequency: Unknown 1 - Last Use / Amount: 08/09/22                       ASAM's:  Six Dimensions of Multidimensional Assessment  Dimension 1:  Acute Intoxication and/or  Withdrawal Potential:      Dimension 2:  Biomedical Conditions and Complications:      Dimension 3:  Emotional, Behavioral, or Cognitive Conditions and Complications:     Dimension 4:  Readiness to Change:     Dimension 5:  Relapse, Continued use, or Continued Problem Potential:     Dimension 6:  Recovery/Living Environment:     ASAM Severity Score:    ASAM Recommended Level of Treatment:     Substance use Disorder (SUD)    Recommendations for Services/Supports/Treatments:    DSM5 Diagnoses: There are no problems to display for this patient.   Patient Centered Plan: Patient is on the following Treatment  Plan(s):  Substance Abuse   Referrals to Alternative Service(s): Referred to Alternative Service(s):   Place:   Date:   Time:    Referred to Alternative Service(s):   Place:   Date:   Time:    Referred to Alternative Service(s):   Place:   Date:   Time:    Referred to Alternative Service(s):   Place:   Date:   Time:      @BHCOLLABOFCARE @  [employer], LCAS-A

## 2023-02-06 IMAGING — CT CT HEAD W/O CM
4 series · 17 of 47 positions shown, 19 images · non-contrast
Comparison: None Available.

CLINICAL DATA: Head trauma, moderate to severe. Headache. Syncope.



[Series 2: head wo · axial · 0.46mm/px · z∈[-122,-2]mm · 7 of 32 slices shown, 9 images]
[im 4/32  brain]
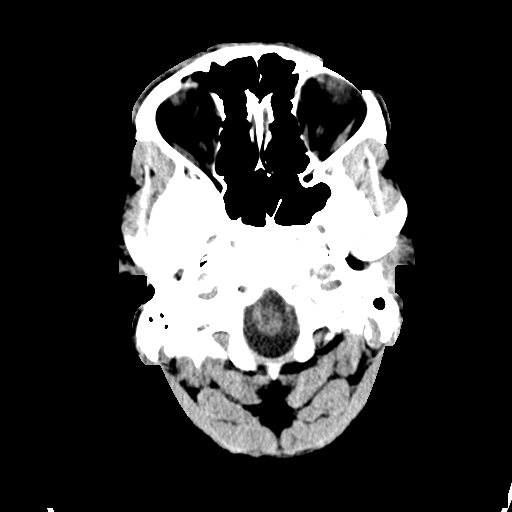
[im 4/32  bone]
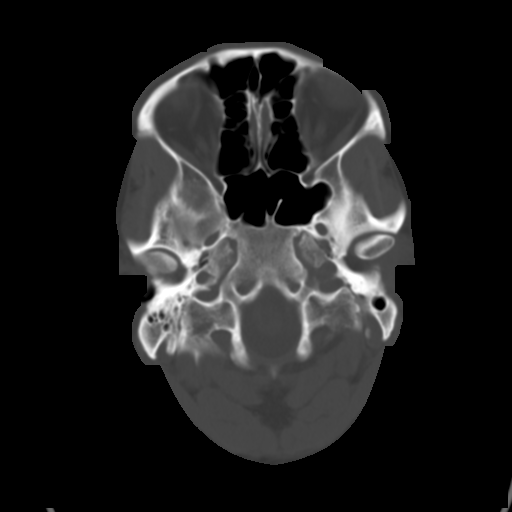
[im 8/32  brain]
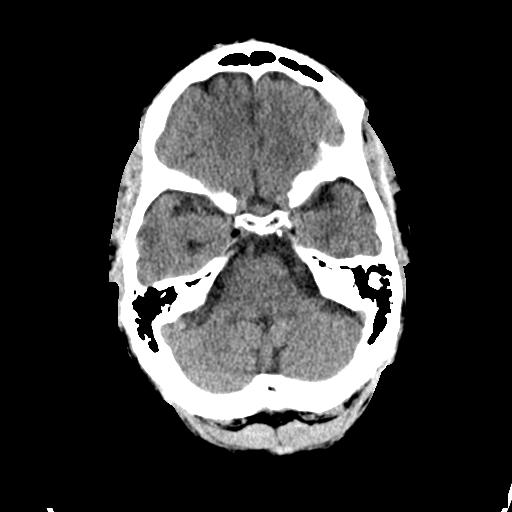
[im 12/32  brain]
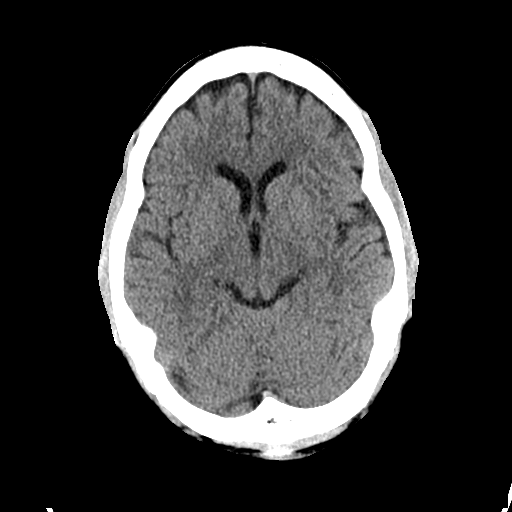
[im 16/32  brain]
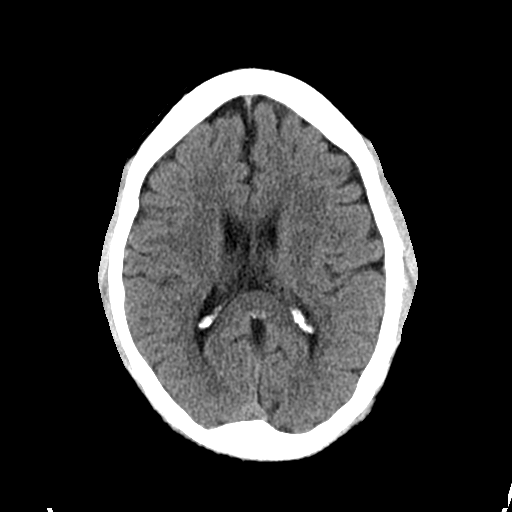
[im 20/32  brain]
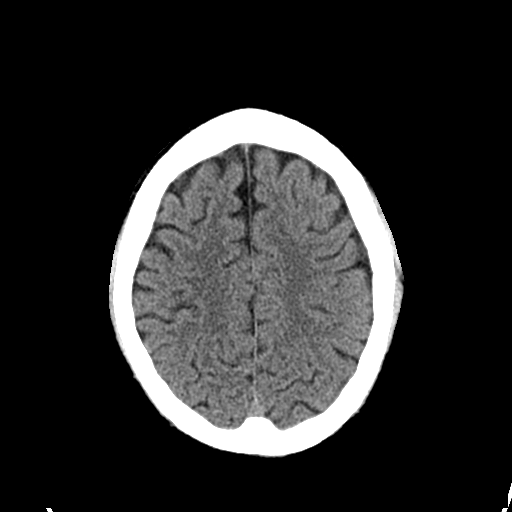
[im 20/32  bone]
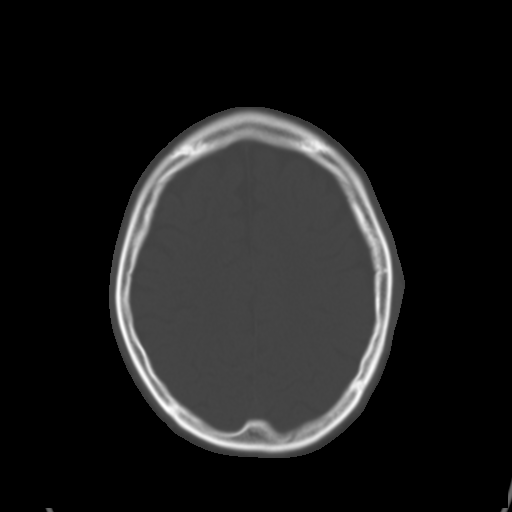
[im 24/32  brain]
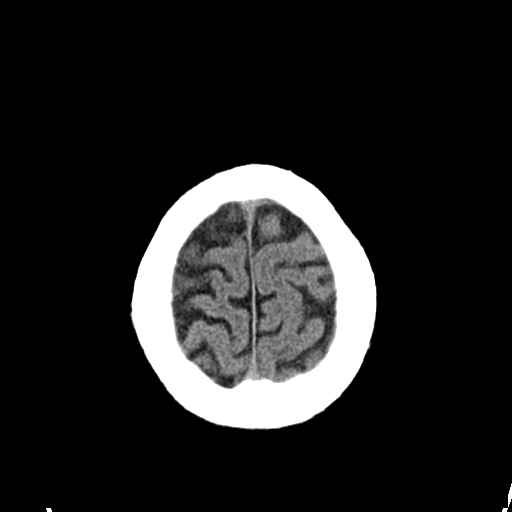
[im 28/32  brain]
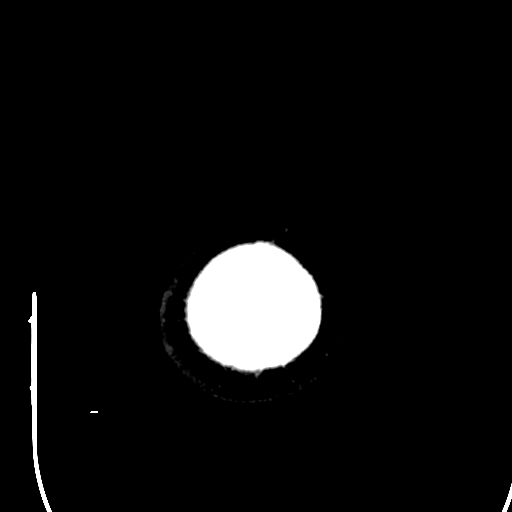

[Series 3: head bone · axial · 0.46mm/px · z∈[-123,-67]mm · 4 of 80 slices shown]
[im 8/80  bone]
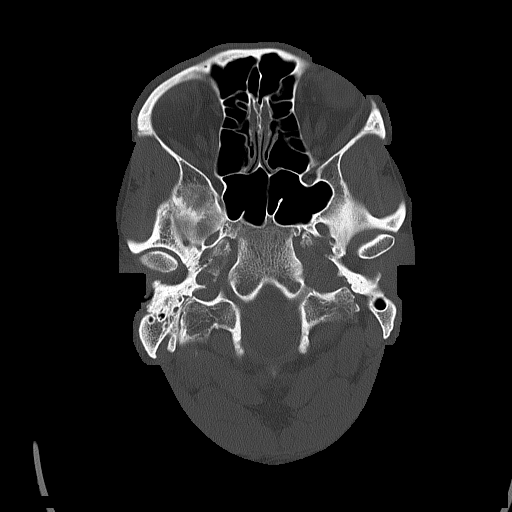
[im 16/80  bone]
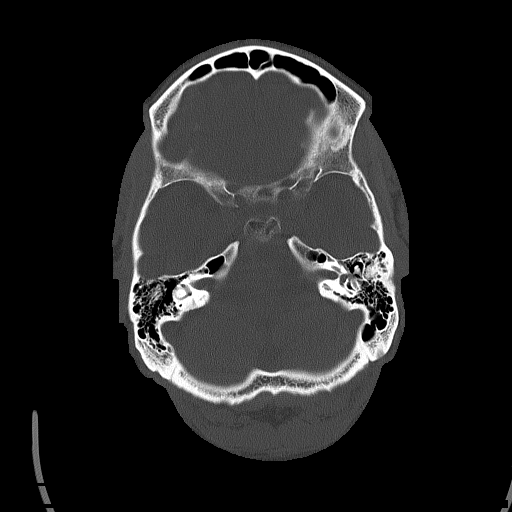
[im 24/80  bone]
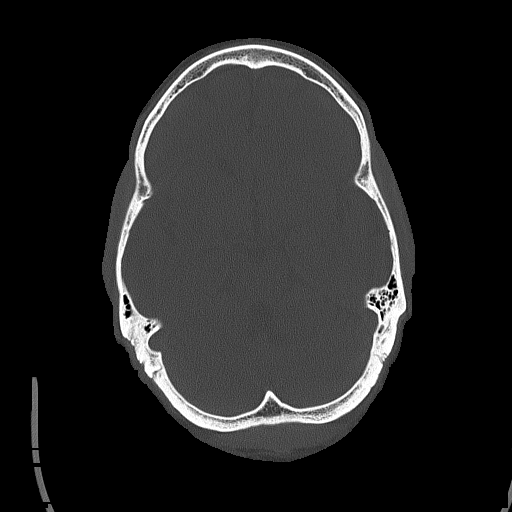
[im 36/80  bone]
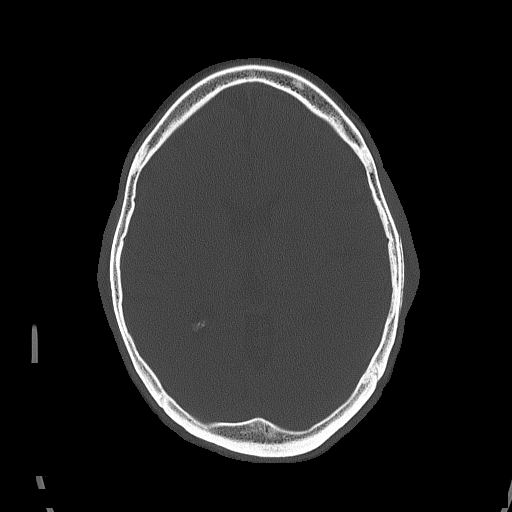

[Series 4: coronal soft tissue · coronal · 0.32mm/px · 3 of 68 slices shown]
[im 23/68  brain]
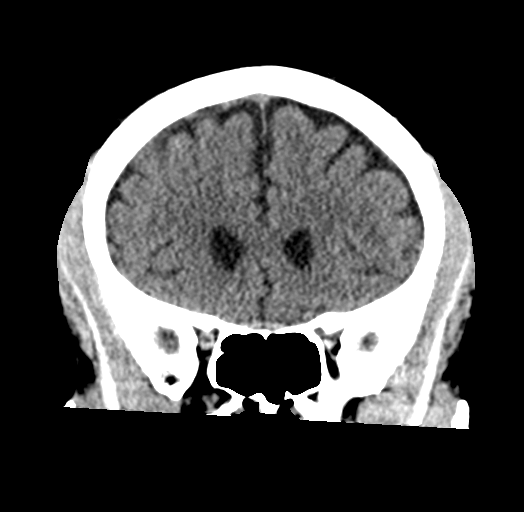
[im 30/68  brain]
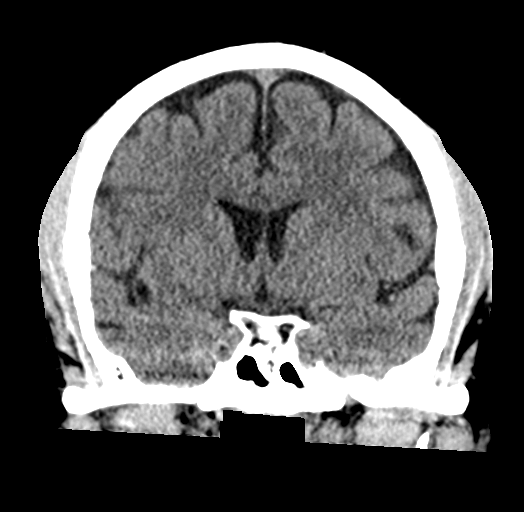
[im 38/68  brain]
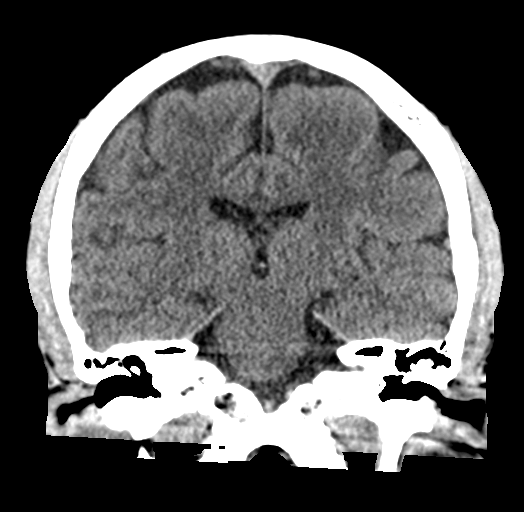

[Series 5: sagittal soft tissue · sagittal · 0.32mm/px · 3 of 56 slices shown]
[im 19/56  brain]
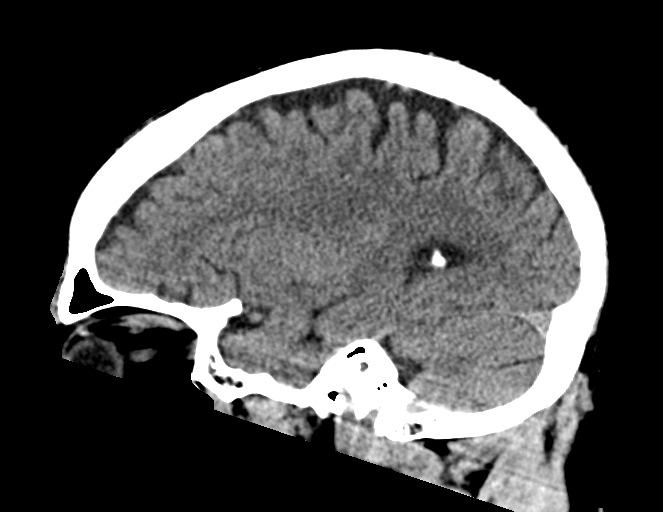
[im 28/56  brain]
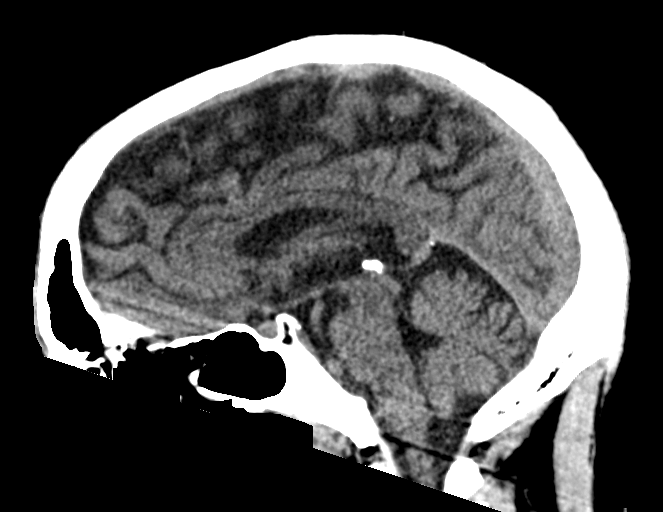
[im 37/56  brain]
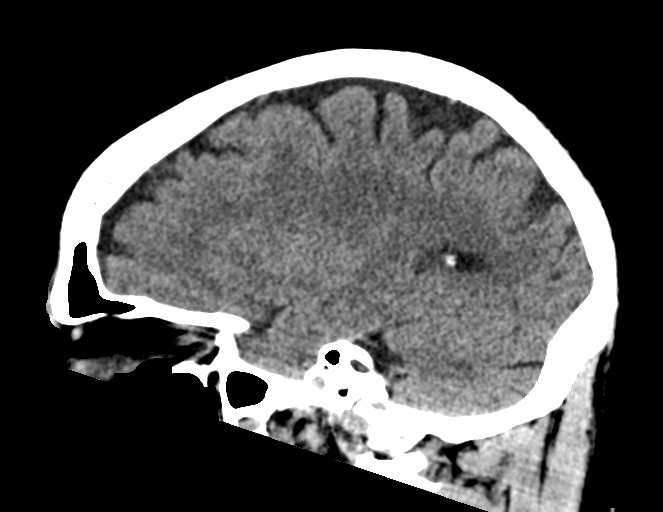

[17 of 47 positions shown; findings below may reference images not displayed]

FINDINGS: Brain: No evidence of acute infarction, hemorrhage, hydrocephalus,
extra-axial collection or mass lesion/mass effect.

Vascular: No hyperdense vessel or unexpected calcification.

Skull: Normal. Negative for fracture or focal lesion.

Sinuses/Orbits: No acute finding.
IMPRESSION: Negative head CT.

## 2023-06-17 ENCOUNTER — Emergency Department: Payer: Managed Care, Other (non HMO)

## 2023-06-17 ENCOUNTER — Emergency Department
Admission: EM | Admit: 2023-06-17 | Discharge: 2023-06-17 | Disposition: A | Discharge status: Home / Self Care | Payer: Managed Care, Other (non HMO) | Attending: Emergency Medicine | Admitting: Emergency Medicine

## 2023-06-17 ENCOUNTER — Other Ambulatory Visit: Payer: Self-pay

## 2023-06-17 ENCOUNTER — Encounter: Payer: Self-pay | Admitting: Emergency Medicine

## 2023-06-17 DIAGNOSIS — E876 Hypokalemia: Secondary | ICD-10-CM | POA: Insufficient documentation

## 2023-06-17 DIAGNOSIS — I10 Essential (primary) hypertension: Secondary | ICD-10-CM | POA: Diagnosis not present

## 2023-06-17 DIAGNOSIS — R200 Anesthesia of skin: Secondary | ICD-10-CM | POA: Diagnosis present

## 2023-06-17 LAB — COMPREHENSIVE METABOLIC PANEL
ALT: 38 U/L (ref 0–44)
AST: 84 U/L — ABNORMAL HIGH (ref 15–41)
Albumin: 4.3 g/dL (ref 3.5–5.0)
Alkaline Phosphatase: 35 U/L — ABNORMAL LOW (ref 38–126)
Anion gap: 13 (ref 5–15)
BUN: 9 mg/dL (ref 6–20)
CO2: 24 mmol/L (ref 22–32)
Calcium: 9 mg/dL (ref 8.9–10.3)
Chloride: 104 mmol/L (ref 98–111)
Creatinine, Ser: 0.85 mg/dL (ref 0.61–1.24)
GFR, Estimated: >60 mL/min (ref >60)
Glucose, Bld: 95 mg/dL (ref 70–99)
Potassium: 3.1 mmol/L — ABNORMAL LOW (ref 3.5–5.1)
Sodium: 141 mmol/L (ref 135–145)
Total Bilirubin: 0.4 mg/dL (ref 0.3–1.2)
Total Protein: 7.6 g/dL (ref 6.5–8.1)

## 2023-06-17 LAB — DIFFERENTIAL
Abs Immature Granulocytes: 0.01 10*3/uL (ref 0.00–0.07)
Basophils Absolute: 0 10*3/uL (ref 0.0–0.1)
Basophils Relative: 0 %
Eosinophils Absolute: 0 10*3/uL (ref 0.0–0.5)
Eosinophils Relative: 0 %
Immature Granulocytes: 0 %
Lymphocytes Relative: 30 %
Lymphs Abs: 1.7 10*3/uL (ref 0.7–4.0)
Monocytes Absolute: 0.6 10*3/uL (ref 0.1–1.0)
Monocytes Relative: 11 %
Neutro Abs: 3.4 10*3/uL (ref 1.7–7.7)
Neutrophils Relative %: 59 %

## 2023-06-17 LAB — CBG MONITORING, ED: Glucose-Capillary: 91 mg/dL (ref 70–99)

## 2023-06-17 LAB — CBC
HCT: 36.9 % — ABNORMAL LOW (ref 39.0–52.0)
Hemoglobin: 12.5 g/dL — ABNORMAL LOW (ref 13.0–17.0)
MCH: 27.5 pg (ref 26.0–34.0)
MCHC: 33.9 g/dL (ref 30.0–36.0)
MCV: 81.3 fL (ref 80.0–100.0)
Platelets: 248 10*3/uL (ref 150–400)
RBC: 4.54 MIL/uL (ref 4.22–5.81)
RDW: 14.7 % (ref 11.5–15.5)
WBC: 5.8 10*3/uL (ref 4.0–10.5)
nRBC: 0 % (ref 0.0–0.2)

## 2023-06-17 LAB — PROTIME-INR
INR: 1 (ref 0.8–1.2)
Prothrombin Time: 13.1 seconds (ref 11.4–15.2)

## 2023-06-17 LAB — ETHANOL: Alcohol, Ethyl (B): 306 mg/dL (ref <10)

## 2023-06-17 LAB — APTT: aPTT: 26 seconds (ref 24–36)

## 2023-06-17 MED ORDER — SODIUM CHLORIDE 0.9 % IV BOLUS
1000.0000 mL | Freq: Once | INTRAVENOUS | Status: AC
  Administered 2023-06-17: 1000 mL via INTRAVENOUS

## 2023-06-17 MED ORDER — ONDANSETRON HCL 4 MG/2ML IJ SOLN: 4.0000 mg | Freq: Once | INTRAMUSCULAR | Status: DC

## 2023-06-17 MED ORDER — SODIUM CHLORIDE 0.9% FLUSH: 3.0000 mL | Freq: Once | INTRAVENOUS | Status: DC

## 2023-06-17 MED ORDER — FENTANYL CITRATE PF 50 MCG/ML IJ SOSY: 50.0000 ug | PREFILLED_SYRINGE | Freq: Once | INTRAMUSCULAR | Status: DC

## 2023-06-17 MED ORDER — POTASSIUM CHLORIDE CRYS ER 20 MEQ PO TBCR
40.0000 meq | EXTENDED_RELEASE_TABLET | Freq: Once | ORAL | Status: AC
  Administered 2023-06-17: 40 meq via ORAL
  Filled 2023-06-17: qty 2

## 2023-06-17 MED ORDER — KETOROLAC TROMETHAMINE 15 MG/ML IJ SOLN
15.0000 mg | Freq: Once | INTRAMUSCULAR | Status: AC
  Administered 2023-06-17: 15 mg via INTRAVENOUS
  Filled 2023-06-17: qty 1

## 2023-06-17 NOTE — ED Notes (Signed)
Patient to CT with Tresa Endo, RN at this time.

## 2023-06-17 NOTE — ED Notes (Signed)
Dr Anner Crete notified of ETOH 306. Orders to be placed as needed.

## 2023-06-17 NOTE — ED Provider Notes (Signed)
[facility] Provider Note    Event Date/Time   First MD Initiated Contact with Patient 06/17/23 [phone]     (approximate)   History   Numbness (/)   HPI Christopher Hardin is a 42 y.o. male with HTN who presents today for numbness.  Patient states going to bed last night feeling normal around 8 PM.  He woke up this morning with numbness to his face and left arm.  Denies numbness elsewhere.  Denies weakness elsewhere.  No vision changes, trauma to the head, facial droop, speech changes.  Patient lost his blood pressure medicine 1 week ago and has not taken any since.     Physical Exam   Triage Vital Signs: ED Triage Vitals  Encounter Vitals Group     BP 06/17/23 0912 (!) 149/106     Systolic BP Percentile --      Diastolic BP Percentile --      Pulse Rate 06/17/23 0912 (!) 104     Resp 06/17/23 0912 18     Temp 06/17/23 0915 98.6 F (37 C)     Temp Source 06/17/23 0915 Oral     SpO2 06/17/23 0912 100 %     Weight 06/17/23 0912 160 lb (72.6 kg)     Height 06/17/23 0912 6' (1.829 m)     Head Circumference --      Peak Flow --      Pain Score 06/17/23 0912 0     Pain Loc --      Pain Education --      Exclude from Growth Chart --     Most recent vital signs: Vitals:   06/17/23 1130 06/17/23 1330  BP: 132/88 (!) 131/91  Pulse: 73 87  Resp: 18 14  Temp:  98.3 F (36.8 C)  SpO2: 98% 100%   Physical Exam: I have reviewed the vital signs and nursing notes. General: Awake, alert, no acute distress.  Nontoxic appearing. Head:  Atraumatic, normocephalic.   ENT:  EOM intact, PERRL. Oral mucosa is pink and moist with no lesions. Neck: Neck is supple with full range of motion, No meningeal signs. Cardiovascular:  RRR, No murmurs. Peripheral pulses palpable and equal bilaterally. Respiratory:  Symmetrical chest wall expansion.  No rhonchi, rales, or wheezes.  Good air movement throughout.  No use of accessory muscles.   Musculoskeletal:  No  cyanosis or edema. Moving extremities with full ROM Abdomen:  Soft, nontender, nondistended. Neuro:  GCS 15, moving all four extremities, interacting appropriately. Speech clear. Alert and oriented with cogent speech; 5/5 motor strength all 4 extremities with intact peripheral nerve distributions; diminished sensory/numbness described to the left side of the face and the left arm in comparison to the right side.  No sensory changes to bilateral lower extremities.; no clonus; cerebellar examination normal including Romberg, finger-to-nose, heel-to-shin, and without trunkal ataxia, dysdiadochokinesis or dysmetria; cranial nerves 2-12 intact to gross challenge bilaterally. Psych:  Calm, appropriate.   Skin:  Warm, dry, no rash.     ED Results / Procedures / Treatments   Labs (all labs ordered are listed, but only abnormal results are displayed) Labs Reviewed  CBC - Abnormal; Notable for the following components:      Result Value   Hemoglobin 12.5 (*)    HCT 36.9 (*)    All other components within normal limits  COMPREHENSIVE METABOLIC PANEL - Abnormal; Notable for the following components:   Potassium 3.1 (*)    AST 84 (*)  Alkaline Phosphatase 35 (*)    All other components within normal limits  ETHANOL - Abnormal; Notable for the following components:   Alcohol, Ethyl (B) 306 (*)    All other components within normal limits  PROTIME-INR  APTT  DIFFERENTIAL  CBG MONITORING, ED     EKG My EKG interpretation shows rate of 89, normal sinus rhythm, normal axis.  No acute ST elevations or depressions.   RADIOLOGY CT head without contrast shows no acute pathology per my interpretation.  Independently interpreted MRI brain with no acute abnormalities.   PROCEDURES:  Critical Care performed: Yes, see critical care procedure note(s)  .Critical Care  Performed by: Janith Lima, MD Authorized by: Janith Lima, MD   Critical care provider statement:    Critical care time  (minutes):  35   Critical care time was exclusive of:  Separately billable procedures and treating other patients   Critical care was necessary to treat or prevent imminent or life-threatening deterioration of the following conditions: stroke alert.   Critical care was time spent personally by me on the following activities:  Blood draw for specimens, development of treatment plan with patient or surrogate, discussions with consultants, evaluation of patient's response to treatment, examination of patient, obtaining history from patient or surrogate, ordering and performing treatments and interventions, ordering and review of laboratory studies, ordering and review of radiographic studies and re-evaluation of patient's condition    MEDICATIONS ORDERED IN ED: Medications  sodium chloride flush (NS) 0.9 % injection 3 mL (3 mLs Intravenous Not Given 06/17/23 0930)  sodium chloride 0.9 % bolus 1,000 mL (0 mLs Intravenous Stopped 06/17/23 1146)  ketorolac (TORADOL) 15 MG/ML injection 15 mg (15 mg Intravenous Given 06/17/23 0957)  potassium chloride SA (KLOR-CON M) CR tablet 40 mEq (40 mEq Oral Given 06/17/23 0958)     IMPRESSION / MDM / ASSESSMENT AND PLAN / ED COURSE  I reviewed the triage vital signs and the nursing notes.                              Differential diagnosis includes, but is not limited to, acute CVA, hemorrhagic bleed, electrolyte abnormality, alcohol intoxication, transient numbness.  Patient's presentation is most consistent with acute presentation with potential threat to life or bodily function.  Patient is a 42 year old male presenting today with acute onset left-sided facial numbness and left arm numbness.  Patient is outside the initial 4.5-hour window for tPA but is within 24-hour window for possible thrombectomy.  His symptoms do not meet a large vessel occlusion at this time with only noticing numbness and no other weakness symptoms.  CT head without contrast was  negative.  Laboratory workup shows some mild hypokalemia but otherwise unremarkable.  He did notably have an alcohol level of 306.  This could be possibly contributing to his symptoms with nothing else seen on his exam.  Discussed case with neurology who recommended MRI of brain.  This was negative for acute pathology.  Reassessed patient, and numbness and tingling symptoms had improved.  Unsure of the direct etiology at this time but no suspicion of CVA.  Neurology recommending outpatient follow-up for ongoing assessment and no further workup at this time.  Patient was set up with follow-up and was given strict return precautions.  The patient is on the cardiac monitor to evaluate for evidence of arrhythmia and/or significant heart rate changes. Clinical Course as of 06/17/23 1522  Thu  Jun 17, 2023  0935 CT HEAD CODE STROKE WO CONTRAST Independently interpreted CT with no acute abnormalities [DW]  0952 Comprehensive metabolic panel(!) Mild hypokalemia, otherwise unremarkable [DW]  0959 Given ongoing numbness symptoms, will have neurology evaluate patient.  Will also give medication to treat hypokalemia and headache. [DW]  1039 Alcohol, Ethyl (B)(!!): 306 [DW]  1259 Neurology, Dr. Iver Nestle, recommends MRI of brain.  If negative, then patient can be followed up outpatient for ongoing numbness symptoms. [DW]  1513 MRI impression shows no acute findings to explain patient's symptoms. [DW]  1519 Reassessed patient.  Describes tingling in his hands but no other symptoms.  Neurology recommending outpatient neurology follow-up for further management.  Nothing further inpatient. [DW]    Clinical Course User Index [DW] Janith Lima, MD     FINAL CLINICAL IMPRESSION(S) / ED DIAGNOSES   Final diagnoses:  Left facial numbness  Left arm numbness     Rx / DC Orders   ED Discharge Orders          Ordered    Ambulatory referral to Neurology       Comments: An appointment is requested in  approximately: 2 weeks   06/17/23 1522             Note:  This document was prepared using Dragon voice recognition software and may include unintentional dictation errors.   Janith Lima, MD 06/17/23 [phone]

## 2023-06-17 NOTE — ED Triage Notes (Signed)
Patient to ED via POV for left arm/facial numbness. LWK last night at 8pm when he went to sleep- woke up this AM with symptoms. Equal grip and strength in legs and arms. Also states BP is elevated- been out of BP meds x1 week.

## 2023-06-17 NOTE — ED Notes (Signed)
Pt reports took [facility] powder PTA for h/a this am.

## 2023-06-17 NOTE — ED Notes (Signed)
Delay in MRI: MRI tech came to transport pt to MRI, pt has ankle monitor on that has to be removed prior to scan. MRI tech contacted call center number provided by pt in regards to removing ankle monitor for scan

## 2023-06-17 NOTE — ED Notes (Signed)
CODE STROKE CANCELLED PER DR. Anner Crete MD

## 2023-06-17 NOTE — Progress Notes (Signed)
Patient returned from MRI. RN notified patient was back and informed she could then reach out to the call center number provided by patient to have ankle monitor placed back on.

## 2023-06-17 NOTE — ED Notes (Signed)
Per Lupita Leash with call center, can cut off ankle monitor for MRI if deemed emergency by MD. Emergent situation per Dr Anner Crete. Ankle monitor cut off by this RN.

## 2023-06-17 NOTE — ED Notes (Signed)
This RN contacted Excell Seltzer [phone], notified ankle monitor removed for scan.

## 2023-06-17 NOTE — ED Notes (Signed)
Patient discharged. He has his ankle monitor in his possession at the time of discharge. Made call to probation officer

## 2023-06-17 NOTE — ED Notes (Signed)
See triage note, pt reports woke up with headache and left sided face and left arm numbness, LKW 2000 last night. Reports daily ETOH, last drink last night.  States has been out of bp meds for one week, has been concerned about HTN. Clear speech. Alert and oriented. Able to move all extremities.

## 2023-06-17 NOTE — ED Notes (Signed)
CODE  STROKE  CALLED  TO  [facility]  

## 2023-06-17 NOTE — Discharge Instructions (Addendum)
You were seen in the emergency department today for your left-sided facial numbness and left arm numbness.  No evidence of stroke seen on CT of your head or MRI.  We will have you follow-up with neurology outpatient for ongoing evaluation.  Please return if you notice any worsening symptoms or develop new weakness in any of your extremities.

## 2023-06-17 NOTE — ED Notes (Signed)
Code Stroke called at this time per Anner Crete, MD

## 2023-06-18 NOTE — Progress Notes (Deleted)
Initial neurology clinic note  Christopher Hardin MRN: [id number] DOB: 1981-03-17  Referring provider: Janith Lima, MD  Primary care provider: Pcp, No  Reason for consult:  left face and left arm numbness  Subjective:  This is Christopher Hardin, a 42 y.o. ***-handed male with a medical history of *** who presents to neurology clinic with ***. The patient is accompanied by ***.  ***  Patient presented to [facility] ED on 06/17/23 for numbness in left face and arm. He went to bed in normal state of health on 06/16/23 and woke up on 06/17/23 with symptoms. He had no weakness, vision changes, facial droop. We went to ED and mentioned losing his BP meds and not taking anything in the last week.   CT head and MRI brain in the ED were unremarkable. EtOH was very elevated (as it has been on multiple ED visits).  ***  MEDICATIONS:  Outpatient Encounter Medications as of 06/24/2023  Medication Sig   cyclobenzaprine (FLEXERIL) 5 MG tablet Take 1 tablet (5 mg total) by mouth 3 (three) times daily as needed for muscle spasms. (Patient not taking: Reported on 12/20/2019)   hydrochlorothiazide (HYDRODIURIL) 25 MG tablet Take 1 tablet (25 mg total) by mouth daily.   losartan (COZAAR) 100 MG tablet Take 100 mg by mouth daily.   No facility-administered encounter medications on file as of 06/24/2023.    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  ***  Labs and Imaging review: Internal labs: 06/17/23: EtOH: 306 CMP significant for chronic hypokalemia (K 3.1), AST 84 (ALT normal) CBC unremarkable ***  External labs: 02/19/22: dsDNA negative ANCA negative ANA negative Sickle cell screen negative  Imaging: CT head wo contrast (06/17/23): FINDINGS: Brain: No evidence of acute infarction, hemorrhage, hydrocephalus, extra-axial collection or mass lesion/mass effect.   Vascular: No hyperdense vessel or unexpected calcification.   Skull: Normal. Negative for fracture or focal lesion.   Sinuses/Orbits: No middle ear or mastoid effusion. Paranasal sinuses are clear. Visualized orbits are unremarkable.   Other: None.   ASPECTS [facility] Stroke Program Early CT Score): 10   IMPRESSION: No hemorrhage or CT evidence of an acute cortical infarct  MRI brain wo contrast (06/17/23): FINDINGS: Brain: No acute infarction, hemorrhage, hydrocephalus, extra-axial collection or mass lesion. No abnormality seen along the course of the left trigeminal nerve.   Vascular: Normal flow voids.   Skull and upper cervical spine: Normal marrow signal.   Sinuses/Orbits: Negative.   IMPRESSION: No acute finding or  explanation for symptoms. ***  Assessment/Plan:  Christopher Hardin is a 42 y.o. male who presents for evaluation of ***. *** has a relevant medical history of ***. *** neurological examination is pertinent for ***. Available diagnostic data is significant for ***. This constellation of symptoms and objective data would most likely localize to ***. ***  PLAN: -Blood work: *** ***  -Return to clinic ***  The impression above as well as the plan as outlined below were  extensively discussed with the patient (in the company of ***) who voiced understanding. All questions were answered to their satisfaction.  The patient was counseled on pertinent fall precautions per the printed material provided today, and as noted under the "Patient Instructions" section below.***  When available, results of the above investigations and possible further recommendations will be communicated to the patient via telephone/MyChart. Patient to call office if not contacted after expected testing turnaround time.   Total time spent reviewing records, interview, history/exam, documentation, and coordination of care on day of encounter:  *** min   Thank you for allowing me to participate in patient's care.  If I can answer any additional questions, I would be pleased to do so.  Jacquelyne Balint, MD   CC: Pcp, No No address on file  CC: Referring provider: Janith Lima, MD [address] [place],  [place] [address]

## 2023-06-24 ENCOUNTER — Ambulatory Visit: Payer: Managed Care, Other (non HMO) | Admitting: Neurology

## 2023-07-17 ENCOUNTER — Encounter (HOSPITAL_COMMUNITY): Payer: Self-pay | Admitting: Family Medicine

## 2023-07-17 ENCOUNTER — Emergency Department
Admission: EM | Admit: 2023-07-17 | Discharge: 2023-07-17 | Disposition: A | Discharge status: Home / Self Care | Payer: Managed Care, Other (non HMO) | Attending: Emergency Medicine | Admitting: Emergency Medicine

## 2023-07-17 ENCOUNTER — Encounter: Payer: Self-pay | Admitting: Emergency Medicine

## 2023-07-17 ENCOUNTER — Inpatient Hospital Stay (HOSPITAL_COMMUNITY)
Admit: 2023-07-17 | Discharge: 2023-07-23 | DRG: 885 | Disposition: A | Discharge status: Home / Self Care | Payer: 59 | Source: Other Acute Inpatient Hospital | Attending: Psychiatry | Admitting: Psychiatry

## 2023-07-17 ENCOUNTER — Other Ambulatory Visit: Payer: Self-pay

## 2023-07-17 DIAGNOSIS — E876 Hypokalemia: Secondary | ICD-10-CM | POA: Diagnosis present

## 2023-07-17 DIAGNOSIS — F332 Major depressive disorder, recurrent severe without psychotic features: Secondary | ICD-10-CM | POA: Diagnosis present

## 2023-07-17 DIAGNOSIS — F1020 Alcohol dependence, uncomplicated: Secondary | ICD-10-CM | POA: Diagnosis present

## 2023-07-17 DIAGNOSIS — Z8249 Family history of ischemic heart disease and other diseases of the circulatory system: Secondary | ICD-10-CM | POA: Diagnosis not present

## 2023-07-17 DIAGNOSIS — F1014 Alcohol abuse with alcohol-induced mood disorder: Secondary | ICD-10-CM | POA: Diagnosis present

## 2023-07-17 DIAGNOSIS — T50902A Poisoning by unspecified drugs, medicaments and biological substances, intentional self-harm, initial encounter: Secondary | ICD-10-CM | POA: Diagnosis not present

## 2023-07-17 DIAGNOSIS — F17290 Nicotine dependence, other tobacco product, uncomplicated: Secondary | ICD-10-CM | POA: Diagnosis present

## 2023-07-17 DIAGNOSIS — F10920 Alcohol use, unspecified with intoxication, uncomplicated: Secondary | ICD-10-CM

## 2023-07-17 DIAGNOSIS — F1094 Alcohol use, unspecified with alcohol-induced mood disorder: Secondary | ICD-10-CM | POA: Diagnosis present

## 2023-07-17 DIAGNOSIS — Z811 Family history of alcohol abuse and dependence: Secondary | ICD-10-CM | POA: Diagnosis not present

## 2023-07-17 DIAGNOSIS — Z9151 Personal history of suicidal behavior: Secondary | ICD-10-CM | POA: Diagnosis not present

## 2023-07-17 DIAGNOSIS — Z5986 Financial insecurity: Secondary | ICD-10-CM

## 2023-07-17 DIAGNOSIS — Z634 Disappearance and death of family member: Secondary | ICD-10-CM

## 2023-07-17 DIAGNOSIS — E871 Hypo-osmolality and hyponatremia: Secondary | ICD-10-CM | POA: Diagnosis present

## 2023-07-17 DIAGNOSIS — Y908 Blood alcohol level of 240 mg/100 ml or more: Secondary | ICD-10-CM | POA: Diagnosis not present

## 2023-07-17 DIAGNOSIS — Z79899 Other long term (current) drug therapy: Secondary | ICD-10-CM

## 2023-07-17 DIAGNOSIS — R45851 Suicidal ideations: Secondary | ICD-10-CM | POA: Diagnosis not present

## 2023-07-17 DIAGNOSIS — I10 Essential (primary) hypertension: Secondary | ICD-10-CM | POA: Diagnosis present

## 2023-07-17 DIAGNOSIS — F17210 Nicotine dependence, cigarettes, uncomplicated: Secondary | ICD-10-CM | POA: Diagnosis present

## 2023-07-17 DIAGNOSIS — Z801 Family history of malignant neoplasm of trachea, bronchus and lung: Secondary | ICD-10-CM

## 2023-07-17 DIAGNOSIS — T1491XA Suicide attempt, initial encounter: Secondary | ICD-10-CM | POA: Insufficient documentation

## 2023-07-17 LAB — COMPREHENSIVE METABOLIC PANEL
ALT: 41 U/L (ref 0–44)
AST: 71 U/L — ABNORMAL HIGH (ref 15–41)
Albumin: 4.9 g/dL (ref 3.5–5.0)
Alkaline Phosphatase: 38 U/L (ref 38–126)
Anion gap: 18 — ABNORMAL HIGH (ref 5–15)
BUN: 13 mg/dL (ref 6–20)
CO2: 26 mmol/L (ref 22–32)
Calcium: 10 mg/dL (ref 8.9–10.3)
Chloride: 94 mmol/L — ABNORMAL LOW (ref 98–111)
Creatinine, Ser: 0.86 mg/dL (ref 0.61–1.24)
GFR, Estimated: >60 mL/min (ref >60)
Glucose, Bld: 104 mg/dL — ABNORMAL HIGH (ref 70–99)
Potassium: 3.3 mmol/L — ABNORMAL LOW (ref 3.5–5.1)
Sodium: 138 mmol/L (ref 135–145)
Total Bilirubin: 0.8 mg/dL (ref 0.3–1.2)
Total Protein: 9 g/dL — ABNORMAL HIGH (ref 6.5–8.1)

## 2023-07-17 LAB — ACETAMINOPHEN LEVEL
Acetaminophen (Tylenol), Serum: <10 ug/mL — ABNORMAL LOW (ref 10–30)
Acetaminophen (Tylenol), Serum: <10 ug/mL — ABNORMAL LOW (ref 10–30)

## 2023-07-17 LAB — CBC
HCT: 38 % — ABNORMAL LOW (ref 39.0–52.0)
Hemoglobin: 13.3 g/dL (ref 13.0–17.0)
MCH: 27.8 pg (ref 26.0–34.0)
MCHC: 35 g/dL (ref 30.0–36.0)
MCV: 79.3 fL — ABNORMAL LOW (ref 80.0–100.0)
Platelets: 284 10*3/uL (ref 150–400)
RBC: 4.79 MIL/uL (ref 4.22–5.81)
RDW: 14.2 % (ref 11.5–15.5)
WBC: 5.8 10*3/uL (ref 4.0–10.5)
nRBC: 0 % (ref 0.0–0.2)

## 2023-07-17 LAB — ETHANOL: Alcohol, Ethyl (B): 374 mg/dL (ref <10)

## 2023-07-17 LAB — URINE DRUG SCREEN, QUALITATIVE (ARMC ONLY)
Amphetamines, Ur Screen: NOT DETECTED
Barbiturates, Ur Screen: NOT DETECTED
Benzodiazepine, Ur Scrn: NOT DETECTED
Cannabinoid 50 Ng, Ur ~~LOC~~: NOT DETECTED
Cocaine Metabolite,Ur ~~LOC~~: NOT DETECTED
MDMA (Ecstasy)Ur Screen: NOT DETECTED
Methadone Scn, Ur: NOT DETECTED
Opiate, Ur Screen: NOT DETECTED
Phencyclidine (PCP) Ur S: NOT DETECTED
Tricyclic, Ur Screen: NOT DETECTED

## 2023-07-17 LAB — SALICYLATE LEVEL
Salicylate Lvl: <7 mg/dL — ABNORMAL LOW (ref 7.0–30.0)
Salicylate Lvl: <7 mg/dL — ABNORMAL LOW (ref 7.0–30.0)

## 2023-07-17 LAB — TROPONIN I (HIGH SENSITIVITY): Troponin I (High Sensitivity): 3 ng/L (ref <18)

## 2023-07-17 MED ORDER — TRAZODONE HCL 50 MG PO TABS
50.0000 mg | ORAL_TABLET | Freq: Every evening | ORAL | Status: DC | PRN
  Administered 2023-07-17 – 2023-07-22 (×6): 50 mg via ORAL
  Filled 2023-07-17 (×6): qty 1

## 2023-07-17 MED ORDER — THIAMINE HCL 100 MG PO TABS
100.0000 mg | ORAL_TABLET | Freq: Every day | ORAL | Status: DC
  Administered 2023-07-17: 100 mg via ORAL
  Filled 2023-07-17 (×2): qty 1

## 2023-07-17 MED ORDER — POTASSIUM CHLORIDE CRYS ER 20 MEQ PO TBCR
40.0000 meq | EXTENDED_RELEASE_TABLET | Freq: Once | ORAL | Status: AC
  Administered 2023-07-17: 40 meq via ORAL
  Filled 2023-07-17: qty 2

## 2023-07-17 MED ORDER — HALOPERIDOL 5 MG PO TABS: 5.0000 mg | ORAL_TABLET | Freq: Four times a day (QID) | ORAL | Status: DC | PRN

## 2023-07-17 MED ORDER — ADULT MULTIVITAMIN W/MINERALS CH
1.0000 | ORAL_TABLET | Freq: Every day | ORAL | Status: DC
  Administered 2023-07-17 – 2023-07-23 (×7): 1 via ORAL
  Filled 2023-07-17 (×11): qty 1

## 2023-07-17 MED ORDER — LOPERAMIDE HCL 2 MG PO CAPS: 2.0000 mg | ORAL_CAPSULE | ORAL | Status: AC | PRN

## 2023-07-17 MED ORDER — HYDROXYZINE HCL 25 MG PO TABS: 25.0000 mg | ORAL_TABLET | Freq: Four times a day (QID) | ORAL | Status: AC | PRN

## 2023-07-17 MED ORDER — THIAMINE HCL 100 MG PO TABS
100.0000 mg | ORAL_TABLET | Freq: Every day | ORAL | Status: DC
  Administered 2023-07-17 – 2023-07-23 (×7): 100 mg via ORAL
  Filled 2023-07-17 (×11): qty 1

## 2023-07-17 MED ORDER — LORAZEPAM 1 MG PO TABS: 1.0000 mg | ORAL_TABLET | Freq: Four times a day (QID) | ORAL | Status: AC | PRN

## 2023-07-17 MED ORDER — ALUM & MAG HYDROXIDE-SIMETH 200-200-20 MG/5ML PO SUSP: 30.0000 mL | ORAL | Status: DC | PRN

## 2023-07-17 MED ORDER — MAGNESIUM HYDROXIDE 400 MG/5ML PO SUSP: 30.0000 mL | Freq: Every day | ORAL | Status: DC | PRN

## 2023-07-17 MED ORDER — ONDANSETRON 4 MG PO TBDP: 4.0000 mg | ORAL_TABLET | Freq: Four times a day (QID) | ORAL | Status: AC | PRN

## 2023-07-17 MED ORDER — BENZTROPINE MESYLATE 1 MG PO TABS: 1.0000 mg | ORAL_TABLET | Freq: Four times a day (QID) | ORAL | Status: DC | PRN

## 2023-07-17 MED ORDER — LORAZEPAM 2 MG/ML IJ SOLN: 0.0000 mg | Freq: Four times a day (QID) | INTRAMUSCULAR | Status: DC

## 2023-07-17 MED ORDER — ACETAMINOPHEN 325 MG PO TABS: 650.0000 mg | ORAL_TABLET | Freq: Four times a day (QID) | ORAL | Status: DC | PRN

## 2023-07-17 MED ORDER — VITAMIN B-1 100 MG PO TABS
100.0000 mg | ORAL_TABLET | Freq: Every day | ORAL | Status: DC
  Filled 2023-07-17 (×4): qty 1

## 2023-07-17 MED ORDER — HYDROCHLOROTHIAZIDE 25 MG PO TABS
25.0000 mg | ORAL_TABLET | Freq: Every day | ORAL | Status: DC
  Administered 2023-07-17 – 2023-07-18 (×2): 25 mg via ORAL
  Filled 2023-07-17 (×6): qty 1

## 2023-07-17 MED ORDER — SODIUM CHLORIDE 0.9 % IV BOLUS
1000.0000 mL | Freq: Once | INTRAVENOUS | Status: AC
  Administered 2023-07-17: 1000 mL via INTRAVENOUS

## 2023-07-17 MED ORDER — LOSARTAN POTASSIUM 50 MG PO TABS
100.0000 mg | ORAL_TABLET | Freq: Every day | ORAL | Status: DC
  Administered 2023-07-18: 100 mg via ORAL
  Filled 2023-07-17 (×4): qty 2

## 2023-07-17 NOTE — BH Assessment (Signed)
PATIENT BED AVAILABLE AFTER 2PM ON 07/17/23  Patient has been accepted to [facility] Patient assigned to room 406 Bed 2 Accepting physician is Dr. Sherron Flemings.  Call report to [phone].  Representative was [facility].   ER Staff is aware of it:  [facility] ER Secretary  Dr. Dolores Frame, ER MD  Dickie La Patient's Nurse

## 2023-07-17 NOTE — ED Notes (Signed)
EMTALA Reviewed by this RN.

## 2023-07-17 NOTE — ED Notes (Addendum)
First Nurse Note: Pt arrives via [facility] from home for an intentional OD on 30 Chlorthalidone (25mg ) and unknown amount of 25mg  of hydrochlorothiazide around 2330, 2 shots of liqour and a 40oz of beer. Pt stating to EMS "I'm tired of being tired".   VSS with EMS  Poison Control contacted by Consulting civil engineer.

## 2023-07-17 NOTE — Consult Note (Signed)
Telepsych Consultation   Reason for Consult:  Psych Evaluation  Referring Physician:  Dr. Dolores Frame Location of Patient: [facility] ER Location of Provider: Behavioral Health TTS Department  Patient Identification: Christopher Hardin MRN:  [id number] Principal Diagnosis: Alcohol-induced mood disorder (HCC) Diagnosis:  Principal Problem:   Alcohol-induced mood disorder (HCC) Active Problems:   Suicide attempt [facility])   MDD (major depressive disorder), recurrent episode, severe (HCC)   Total Time spent with patient: 45 minutes  Subjective:  " I took a bunch of blood pressure medication"   HPI:  Tele psych Assessment  Christopher Hardin, 42 y.o., male patient seen via tele health by TTS and this provider; chart reviewed and consulted with Dr. Dolores Frame on 07/17/23.  Per chart review, triage note states, First Nurse Note: Pt arrives via [facility] from home for an intentional OD on 30 Chlorthalidone (25mg ) and unknown amount of 25mg  of hydrochlorothiazide around 2330, 2 shots of liqour and a 40oz of beer. Pt stating to EMS "I'm tired of being tired".    On evaluation Christopher Hardin reports that he overdosed due to stress and bills.  He states that this not the first time trying to hurt himself.  He once tried to cut himself. Denies past psychiatric hospitalization.  Tonight he says  he laid on the couch with his mother  and she asked him what was wrong and told her he took a whole bottle of bp pills tonight.  When asked if he's sorry that it did not work, patient replies that he's glad it didn't.  He also admits to having a drinking problem and would like help.  His BAL is currently  374.   He says he usually drinks a 40 ounce of beer and a few shots a day.  He says he's been drinking since he was 14.  At this time he says he has been coping with the loss of his aunt and dad and drinking more.  He states that she has been trying to get counseling but always find it  difficult to find one that takes  his insurance.    During evaluation Christopher Hardin is laying in bed; he is alert/oriented x 4; depressed/calm/cooperative; and mood congruent with affect.  Patient is speaking in a clear tone at moderate volume, and normal pace; with good eye contact.  His thought process is coherent and relevant; There is no indication that he is currently responding to internal/external stimuli or experiencing delusional thought content.  At this time patient denies suicidal/self-harm/homicidal ideation, psychosis, and paranoia.  Patient has remained calm throughout assessment and has answered questions appropriately.    Recommendations:  Psychiatric inpatient hospitalization  Dr. 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   Family History:  Family History  Problem Relation Age of Onset   Bladder Cancer Neg Hx    Prostate cancer Neg Hx    Kidney cancer Neg Hx    Family Psychiatric  History: unknown Social History:  Social History  Substance and Sexual Activity  Alcohol Use Yes   Alcohol/week: 1.0 standard drink of alcohol   Types: 1 Cans of beer per week     Social History   Substance and Sexual Activity  Drug Use No    Social History   Socioeconomic History   Marital status: Single    Spouse name: Not on file   Number of children: Not on file   Years of education: Not on file   Highest education level: Not on file  Occupational History   Not on file  Tobacco Use   Smoking status: Every Day    Current  packs/day: 0.25    Types: Cigarettes   Smokeless tobacco: Never  Substance and Sexual Activity   Alcohol use: Yes    Alcohol/week: 1.0 standard drink of alcohol    Types: 1 Cans of beer per week   Drug use: No   Sexual activity: Not on file  Other Topics Concern   Not on file  Social History Narrative   Not on file   Social Determinants of Health   Financial Resource Strain: Not on file  Food Insecurity: Not on file  Transportation Needs: Not on file  Physical Activity: Not on file  Stress: Not on file  Social Connections: Not on file   Additional Social History:    Allergies:  No Known Allergies  Labs:  Results for orders placed or performed during the hospital encounter of 07/17/23 (from the past 48 hour(s))  Comprehensive metabolic panel     Status: Abnormal   Collection Time: 07/17/23 12:38 AM  Result Value Ref Range   Sodium 138 135 - 145 mmol/L   Potassium 3.3 (L) 3.5 - 5.1 mmol/L   Chloride 94 (L) 98 - 111 mmol/L   CO2 26 22 - 32 mmol/L   Glucose, Bld 104 (H) 70 - 99 mg/dL    Comment: Glucose reference range applies only to samples taken after fasting for at least 8 hours.   BUN 13 6 - 20 mg/dL   Creatinine, Ser 1.61 0.61 - 1.24 mg/dL   Calcium 09.6 8.9 - 04.5 mg/dL   Total Protein 9.0 (H) 6.5 - 8.1 g/dL   Albumin 4.9 3.5 - 5.0 g/dL   AST 71 (H) 15 - 41 U/L   ALT 41 0 - 44 U/L   Alkaline Phosphatase 38 38 - 126 U/L   Total Bilirubin 0.8 0.3 - 1.2 mg/dL   GFR, Estimated >40 >98 mL/min    Comment: (NOTE) Calculated using the CKD-EPI Creatinine Equation (2021)    Anion gap 18 (H) 5 - 15    Comment: Performed at [facility], [address] [place], [place] [address]  Ethanol     Status: Abnormal   Collection Time: 07/17/23 12:38 AM  Result Value Ref Range   Alcohol, Ethyl (B) 374 (HH) <10 mg/dL    Comment: CRITICAL RESULT CALLED TO, READ BACK BY AND VERIFIED WITH BRODY EASTEP RN @ 0221 07/17/23 [facility] (NOTE) Lowest detectable limit for serum  alcohol is 10 mg/dL.  For medical purposes only. Performed at [facility], [address] [place], [place] [address]   Salicylate level     Status: Abnormal   Collection Time: 07/17/23 12:38 AM  Result Value Ref Range   Salicylate Lvl <7.0 (L) 7.0 - 30.0 mg/dL    Comment: Performed at [facility], [address] [place], [place] [address]  Acetaminophen level     Status: Abnormal  Collection Time: 07/17/23 12:38 AM  Result Value Ref Range   Acetaminophen (Tylenol), Serum <10 (L) 10 - 30 ug/mL    Comment: (NOTE) Therapeutic concentrations vary significantly. A range of 10-30 ug/mL  may be an effective concentration for many patients. However, some  are best treated at concentrations outside of this range. Acetaminophen concentrations >150 ug/mL at 4 hours after ingestion  and >50 ug/mL at 12 hours after ingestion are often associated with  toxic reactions.  Performed at [facility], [address] [place], [place] [address]   cbc     Status: Abnormal   Collection Time: 07/17/23 12:38 AM  Result Value Ref Range   WBC 5.8 4.0 - 10.5 K/uL   RBC 4.79 4.22 - 5.81 MIL/uL   Hemoglobin 13.3 13.0 - 17.0 g/dL   HCT 60.4 (L) 54.0 - 98.1 %   MCV 79.3 (L) 80.0 - 100.0 fL   MCH 27.8 26.0 - 34.0 pg   MCHC 35.0 30.0 - 36.0 g/dL   RDW 19.1 47.8 - 29.5 %   Platelets 284 150 - 400 K/uL   nRBC 0.0 0.0 - 0.2 %    Comment: Performed at [facility], [address], [place], [place] [address]  Urine Drug Screen, Qualitative     Status: None   Collection Time: 07/17/23 12:38 AM  Result Value Ref Range   Tricyclic, Ur Screen NONE DETECTED NONE DETECTED   Amphetamines, Ur Screen NONE DETECTED NONE DETECTED   MDMA (Ecstasy)Ur Screen NONE DETECTED NONE DETECTED   Cocaine Metabolite,Ur [place] NONE DETECTED NONE DETECTED   Opiate, Ur Screen NONE DETECTED NONE DETECTED   Phencyclidine (PCP) Ur S NONE DETECTED NONE DETECTED   Cannabinoid 50 Ng, Ur [place] NONE  DETECTED NONE DETECTED   Barbiturates, Ur Screen NONE DETECTED NONE DETECTED   Benzodiazepine, Ur Scrn NONE DETECTED NONE DETECTED   Methadone Scn, Ur NONE DETECTED NONE DETECTED    Comment: (NOTE) Tricyclics + metabolites, urine    Cutoff 1000 ng/mL Amphetamines + metabolites, urine  Cutoff 1000 ng/mL MDMA (Ecstasy), urine              Cutoff 500 ng/mL Cocaine Metabolite, urine          Cutoff 300 ng/mL Opiate + metabolites, urine        Cutoff 300 ng/mL Phencyclidine (PCP), urine         Cutoff 25 ng/mL Cannabinoid, urine                 Cutoff 50 ng/mL Barbiturates + metabolites, urine  Cutoff 200 ng/mL Benzodiazepine, urine              Cutoff 200 ng/mL Methadone, urine                   Cutoff 300 ng/mL  The urine drug screen provides only a preliminary, unconfirmed analytical test result and should not be used for non-medical purposes. Clinical consideration and professional judgment should be applied to any positive drug screen result due to possible interfering substances. A more specific alternate chemical method must be used in order to obtain a confirmed analytical result. Gas chromatography / mass spectrometry (GC/MS) is the preferred confirm atory method. Performed at [facility], [address] [place], [place] [address]   Troponin I (High Sensitivity)     Status: None   Collection Time: 07/17/23 12:38 AM  Result Value Ref Range   Troponin I (High Sensitivity) 3 <18 ng/L  Comment: (NOTE) Elevated high sensitivity troponin I (hsTnI) values and significant  changes across serial measurements may suggest ACS but many other  chronic and acute conditions are known to elevate hsTnI results.  Refer to the "Links" section for chest pain algorithms and additional  guidance. Performed at [facility], [address] [place], [place] [address]     Medications:  Current Facility-Administered Medications  Medication Dose Route Frequency Provider  Last Rate Last Admin   LORazepam (ATIVAN) injection 0-4 mg  0-4 mg Intravenous Q6H Irean Hong, MD       thiamine (VITAMIN B1) tablet 100 mg  100 mg Oral Daily Irean Hong, MD       Current Outpatient Medications  Medication Sig Dispense Refill   chlorthalidone (HYGROTON) 25 MG tablet Take 25 mg by mouth daily.     hydrochlorothiazide (HYDRODIURIL) 25 MG tablet Take 1 tablet (25 mg total) by mouth daily. 30 tablet 1   cyclobenzaprine (FLEXERIL) 5 MG tablet Take 1 tablet (5 mg total) by mouth 3 (three) times daily as needed for muscle spasms. (Patient not taking: Reported on 12/20/2019) 15 tablet 0   losartan (COZAAR) 100 MG tablet Take 100 mg by mouth daily. (Patient not taking: Reported on 07/17/2023)      Musculoskeletal: Strength & Muscle Tone: within normal limits Gait & Station: normal Patient leans: N/A  Psychiatric Specialty Exam:  Presentation  General Appearance: Appropriate for Environment; Casual  Eye Contact:Fair  Speech:Clear and Coherent  Speech Volume:Normal  Handedness:No data recorded  Mood and Affect  Mood:Anxious; Depressed; Hopeless  Affect:Appropriate   Thought Process  Thought Processes:Coherent  Descriptions of Associations:Intact  Orientation:Full (Time, Place and Person)  Thought Content:Logical  History of Schizophrenia/Schizoaffective disorder:No  Duration of Psychotic Symptoms:No data recorded Hallucinations:Hallucinations: None  Ideas of Reference:None  Suicidal Thoughts:Suicidal Thoughts: Yes, Active SI Active Intent and/or Plan: With Plan  Homicidal Thoughts:Homicidal Thoughts: No   Sensorium  Memory:Immediate Fair; Remote Fair  Judgment:Impaired  Insight:Fair   Executive Functions  Concentration:Fair  Attention Span:Fair  Recall:Fair  Fund of Knowledge:Fair  Language:Fair   Psychomotor Activity  Psychomotor Activity:Psychomotor Activity: Normal   Assets  Assets:Housing; Financial Resources/Insurance;  Desire for Improvement; Communication Skills; Leisure Time; Social Support   Sleep  Sleep:Sleep: Poor    Physical Exam: Physical Exam Vitals and nursing note reviewed.    ROS Blood pressure 104/62, pulse 63, temperature 98 F (36.7 C), temperature source Oral, resp. rate 17, height 6' (1.829 m), weight 73.1 kg, SpO2 94%. Body mass index is 21.86 kg/m.  Treatment Plan Summary: Daily contact with patient to assess and evaluate symptoms and progress in treatment, Medication management, and Plan  Alessio Jadie Hillen was admitted to [facility] for Alcohol-induced mood disorder [facility]), crisis management, and stabilization. Routine labs ordered, which include Lab Orders         Comprehensive metabolic panel         Ethanol         Salicylate level         Acetaminophen level         cbc         Urine Drug Screen, Qualitative         Acetaminophen level         Salicylate level    Medication Management: Medications started  LORazepam  0-4 mg Intravenous Q6H   thiamine  100 mg Oral Daily   Will maintain observation checks every 15 minutes for safety. Psychosocial education regarding relapse  prevention and self-care; social and communication  Social work will consult with family for collateral information and discuss discharge and follow up plan.  Disposition: Recommend psychiatric Inpatient admission when medically cleared. Supportive therapy provided about ongoing stressors. Discussed crisis plan, support from social network, calling 911, coming to the Emergency Department, and calling Suicide Hotline.  This service was provided via telemedicine using a 2-way, interactive audio and video technology. Jearld Lesch, NP 07/17/2023 3:41 AM

## 2023-07-17 NOTE — ED Notes (Addendum)
Patient change out into hospital safe clothing at this time. This RN and Doree Fudge, EDT as witness. Pt belongings placed in hospital safe location.   Patient belongings as follows:  1 pair socks 1 pair sandals 1 pair grey pants  1 grey sweatshirt 1 pair underwear 1 cell phone  1 wallet  1 vape 1 set of keys 1 beaded bracelet  Patient has ankle monitor in place to R ankle that RN unable to remove.

## 2023-07-17 NOTE — ED Notes (Signed)
Attempted to call report to [facility]. I had received a phone call back while I was triaging another pt and wasn't able to speak with them. The [facility] admissions RN will give me a call back.

## 2023-07-17 NOTE — Progress Notes (Signed)
Admit Note:   Francisca Shively is a 42 year old male presenting to [facility] IVC. Per chart review, Pt had an intentional OD on 30 Chlorthalidone (25mg ) and unknown amount of 25mg  of hydrochlorothiazide around 2330, 2 shots of liquor and a 40oz of beer. Pt reports "I took a bunch of blood pressure meds due to stress, job, money, bills." Pt states he works at [employer]. Pt reported his mother called EMS. Pt reports a past attempt to hurt himself via cutting "1 year ago." Patient is not currently engaged in any outpatient treatment, he reports that his dad passed away due to stage 4 lung cancer and his Aunt passed away 1 year ago who was like "a second mom to me." Patient also reports being a daily drinker "a 40 oz beer and a couple of shots" he reports drinking since the age of 41. Patient denies HI/AVH.  BAL was 374 on arrival. Pt oriented to unit rules and procedures. Skin was assessed and found to be WNL with the exception of bilateral tattoos on posterior scapulas, scratch from work on left forearm, and wearing a ankle monitor on right ankle. Pt states he has a court date on Wednesday 07/21/23 at 0900 for simple assault. Belongings in locker #31. Pt signed consents. Snacks and fluids offered in which Pt accepted. Pt is able to verbal contract for safety. Pt remains safe.

## 2023-07-17 NOTE — [facility] Group Notes (Signed)
[facility] Group Notes:  (Nursing/MHT/Case Management/Adjunct)  Date:  07/17/2023  Time:  8:38 PM  Type of Therapy:  The focus of this group is to help patients review their daily goal of treatment and discuss progress on daily workbooks.    Participation Level:  Active  Participation Quality:  Appropriate  Affect:  Appropriate  Cognitive:  Appropriate  Insight:  Appropriate  Engagement in Group:  Supportive  Modes of Intervention:  Socialization  Summary of Progress/Problems: Pt attended group  Christopher Hardin 07/17/2023, 8:38 PM

## 2023-07-17 NOTE — ED Notes (Signed)
Patient has been accepted to [facility] /assigned to room 406 bed 2 /accepting  physician is Dr. Sherron Flemings  call report [phone] rep was [place] patient bed available after 2pm today

## 2023-07-17 NOTE — ED Notes (Signed)
Attempted report to [facility]

## 2023-07-17 NOTE — ED Notes (Signed)
Attempted to call mother Dwana Curd) and message left d/t him needing his ankle monitor charger.

## 2023-07-17 NOTE — ED Provider Notes (Signed)
[facility] Provider Note    Event Date/Time   First MD Initiated Contact with Patient 07/17/23 0115     (approximate)   History   Drug Overdose   HPI  Christopher Hardin is a 42 y.o. male brought to the ED via EMS from home with a chief complaint of intentional overdose.  Patient with a history of hypertension who states he took 30 tablets of Chlorthalidone 25mg , and unknown amount of Hydrochlorothiazide 25 mg around 11:30 PM.  Endorses EtOH use.  Denies HI/AH/VH.  Denies chest pain, shortness of breath, abdominal pain, nausea, vomiting or dizziness.     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 Authorizing Provider  chlorthalidone (HYGROTON) 25 MG tablet Take 25 mg by mouth daily. 06/22/23 07/22/23 Yes [provider]  hydrochlorothiazide (HYDRODIURIL) 25 MG tablet Take 1 tablet (25 mg total) by mouth daily. 12/01/21  Yes Don Perking, [place], MD  cyclobenzaprine (FLEXERIL) 5 MG tablet Take 1 tablet (5 mg total) by mouth 3 (three) times daily as needed for muscle spasms. Patient not taking: Reported on 12/20/2019 10/02/19   Concha Se, MD  losartan (COZAAR) 100 MG tablet Take 100 mg by mouth daily. Patient not taking: Reported on 07/17/2023 11/13/19   [provider]     Allergies  Patient has  no known allergies.   Family History   Family History  Problem Relation Age of Onset   Bladder Cancer Neg Hx    Prostate cancer Neg Hx    Kidney cancer Neg Hx      Physical Exam  Triage Vital Signs: ED Triage Vitals  Encounter Vitals Group     BP 07/17/23 0045 (!) 137/100     Systolic BP Percentile --      Diastolic BP Percentile --      Pulse Rate 07/17/23 0045 69     Resp 07/17/23 0045 16     Temp 07/17/23 0045 98 F (36.7 C)     Temp Source 07/17/23 0045 Oral     SpO2 07/17/23 0045 98 %     Weight 07/17/23 0056 161 lb 2.5 oz (73.1 kg)     Height 07/17/23 0056 6' (1.829 m)     Head Circumference --      Peak Flow --      Pain Score 07/17/23 0056 0     Pain Loc --      Pain Education --      Exclude from Growth Chart --     Updated Vital Signs: BP 104/62   Pulse 63   Temp 98 F (36.7 C) (Oral)   Resp 17   Ht 6' (1.829 m)   Wt 73.1 kg   SpO2 94%   BMI 21.86 kg/m    General: Awake, no distress.  CV:  RRR.  Good peripheral perfusion.  Resp:  Normal effort.  CTAB. Abd:  Nontender.  No distention.  Other:  Flat affect.   ED Results / Procedures / Treatments  Labs (all labs ordered are listed, but only abnormal results are displayed) Labs Reviewed  COMPREHENSIVE METABOLIC PANEL - Abnormal; Notable for the following components:      Result Value   Potassium 3.3 (*)    Chloride 94 (*)    Glucose, Bld 104 (*)    Total Protein 9.0 (*)    AST 71 (*)    Anion gap 18 (*)    All other components within normal limits  ETHANOL - Abnormal; Notable for the following components:   Alcohol, Ethyl (B) 374 (*)    All other components within normal limits  SALICYLATE LEVEL - Abnormal; Notable for the following components:   Salicylate Lvl <7.0 (*)    All other components within normal limits  ACETAMINOPHEN LEVEL - Abnormal; Notable for the following components:   Acetaminophen (Tylenol), Serum <10 (*)    All other components within normal limits  CBC -  Abnormal; Notable for the following components:   HCT 38.0 (*)    MCV 79.3 (*)    All other components within normal limits  ACETAMINOPHEN LEVEL - Abnormal; Notable for the following components:   Acetaminophen (Tylenol), Serum <10 (*)    All other components within normal limits  SALICYLATE LEVEL - Abnormal; Notable for the following components:   Salicylate Lvl <7.0 (*)    All other components within normal limits  URINE DRUG SCREEN, QUALITATIVE ([facility] ONLY)  TROPONIN I (HIGH SENSITIVITY)     EKG  ED ECG REPORT I, Chameka Mcmullen J, the attending physician, personally viewed and interpreted this ECG.   Date: 07/17/2023  EKG Time: 0045  Rate: 66  Rhythm: normal sinus rhythm  Axis: Normal  Intervals:none  ST&T Change: Nonspecific    RADIOLOGY None   Official radiology report(s): No results found.   PROCEDURES:  Critical Care performed: Yes, see critical care procedure note(s)  CRITICAL CARE Performed by: Irean Hong   Total critical care time: 30 minutes  Critical care time was exclusive of separately billable procedures and treating other patients.  Critical care was necessary to treat or prevent imminent or life-threatening deterioration.  Critical care was time spent personally by me on the following activities: development of treatment plan with patient and/or surrogate as well as nursing, discussions with consultants, evaluation of patient's response to treatment, examination of patient, obtaining history from patient or surrogate, ordering and performing treatments and interventions, ordering and review of laboratory studies, ordering and review of radiographic studies, pulse oximetry and re-evaluation of patient's condition.   Marland Kitchen1-3 Lead EKG Interpretation  Performed by: Irean Hong, MD Authorized by: Irean Hong, MD     Interpretation: normal     ECG rate:  65   ECG rate assessment: normal     Rhythm: sinus rhythm     Ectopy: none     Conduction:  normal   Comments:     Patient placed on cardiac monitor to evaluate for arrhythmias    MEDICATIONS ORDERED IN ED: Medications  thiamine (VITAMIN B1) tablet 100 mg (has no administration in time range)  LORazepam (ATIVAN) injection 0-4 mg (0 mg Intravenous Hold 07/17/23 0524)  potassium chloride SA (KLOR-CON M) CR tablet 40 mEq (40 mEq Oral Given 07/17/23 0330)  sodium chloride 0.9 % bolus 1,000 mL (0 mLs Intravenous Stopped 07/17/23 0451)  IMPRESSION / MDM / ASSESSMENT AND PLAN / ED COURSE  I reviewed the triage vital signs and the nursing notes.                             42 year old male who presents status post intentional overdose on antihypertensives.  Will place under IVC for his safety.  Charge nurse contacted poison control who recommends 4 to 6-hour observation, 4-hour acetaminophen level and supportive treatment.  Will consult psychiatry.  Patient's presentation is most consistent with acute presentation with potential threat to life or bodily function.  The patient is on the cardiac monitor to evaluate for evidence of arrhythmia and/or significant heart rate changes.  The patient has been placed in psychiatric observation due to the need to provide a safe environment for the patient while obtaining psychiatric consultation and evaluation, as well as ongoing medical and medication management to treat the patient's condition.  The patient has been placed under full IVC at this time.  2956 Elevated EtOH noted.  Mild hypokalemia with potassium 3.3, mild elevation of anion gap likely secondary to alcohol intoxication.  Normal white count, acetaminophen/salicylate negative.  UDS unremarkable.  Troponin is negative.  Patient resting in no acute distress.  Remains hemodynamically stable.  Will continue to monitor and care for patient.  2130 Patient evaluated by overnight psychiatry team who recommends inpatient hospitalization.  Pending repeat acetaminophen/salicylate levels as  well as 1 additional hour of medical observation.  8657 Repeat acetaminophen/salicylate levels unremarkable.  Patient remains hemodynamically stable.  He is medically cleared for psychiatric disposition.  FINAL CLINICAL IMPRESSION(S) / ED DIAGNOSES   Final diagnoses:  Intentional drug overdose, initial encounter (HCC)  Alcoholic intoxication without complication (HCC)   Rx / DC Orders   ED Discharge Orders     None        Note:  This document was prepared using Dragon voice recognition software and may include unintentional dictation errors.   Irean Hong, MD 07/17/23 [phone]

## 2023-07-17 NOTE — ED Notes (Signed)
Pt accepted to [facility] room 406-2 to be accepted at 2pm. Report to be called to dayshift [phone]

## 2023-07-17 NOTE — BH Assessment (Signed)
Comprehensive Clinical Assessment (CCA) Note  07/17/2023 Christopher Hardin [id number]  Chief Complaint: Patient is a 42 year old male presenting to [facility] ED under IVC. Per triage note Pt arrives via [facility] from home for an intentional OD on 30 Chlorthalidone (25mg ) and unknown amount of 25mg  of hydrochlorothiazide around 2330, 2 shots of liqour and a 40oz of beer. Pt stating to EMS "I'm tired of being tired" Pt endorses SI and was trying to harm himself due to stress. Pt is endorses fatigue in triage stating he "drank some" too along with the consumption of prescription BP medications. Pt is cooperative in triage. During assessment patient appears alert and oriented x4, calm and cooperative, mood appears depressed. Patient reports "I took a bunch of blood pressure meds due to stress, job, money, bills." Patient reports that he had been drinking tonight "and went to go lay on the couch with my mom and I told her that I took the pills and she called EMS." Patient reports a past attempt to hurt himself via cutting "1 year ago." Patient is not currently engaged in any outpatient treatment, he reports that his dad passed away due to stage 4 lung cancer and his Aunt passed away 1 year ago who was like "a second mom to me." Patient also reports being a daily drinker "a 40 oz beer and a couple of shots" he reports drinking since the age of 38. Patient denies SI/HI/AH/VH.  Per Psyc NP Lerry Liner patient is recommended for Inpatient Chief Complaint  Patient presents with   Drug Overdose   Visit Diagnosis: Depression, Alcohol abuse   CCA Screening, Triage and Referral (STR)  Patient Reported Information How did you hear about [place]? Legal System  Referral name: No data recorded Referral phone number: No data recorded  Whom do you see for routine medical problems? No data recorded Practice/Facility Name: No data recorded Practice/Facility Phone Number: No data recorded Name of Contact: No data  recorded Contact Number: No data recorded Contact Fax Number: No data recorded Prescriber Name: No data recorded Prescriber Address (if known): No data recorded  What Is the Reason for Your Visit/Call Today? Pt arrives via [facility] from home for an intentional OD on 30 Chlorthalidone (25mg ) and unknown amount of 25mg  of hydrochlorothiazide around 2330, 2 shots of liqour and a 40oz of beer. Pt stating to EMS "I'm tired of being tired"  How Long Has This Been Causing You Problems? > than 6 months  What Do You Feel Would Help You the Most Today? Treatment for Depression or other mood problem   Have You Recently Been in Any Inpatient Treatment (Hospital/Detox/Crisis Center/28-Day Program)? No data recorded Name/Location of Program/Hospital:No data recorded How Long Were You There? No data recorded When Were You Discharged? No data recorded  Have You Ever Received Services From [facility] Before? No data recorded Who Do You See at [facility]? No data recorded  Have You Recently Had Any Thoughts About Hurting Yourself? Yes  Are You Planning to Commit Suicide/Harm Yourself At This time? No   Have you Recently Had Thoughts About Hurting Someone Christopher Hardin? No  Explanation: No data recorded  Have You Used Any Alcohol or Drugs in the Past 24 Hours? Yes  How Long Ago Did You Use Drugs or Alcohol? No data recorded What Did You Use and How Much? Alcohol "40 oz beer and a couple of shots"   Do You Currently Have a Therapist/Psychiatrist? No  Name of Therapist/Psychiatrist: No data recorded  Have You Been Recently Discharged From Any Office Practice or Programs? No  Explanation of Discharge From Practice/Program: No data recorded    CCA Screening Triage Referral Assessment Type of Contact: Face-to-Face  Is this Initial or Reassessment? No data recorded Date Telepsych consult ordered in [facility]:  No data recorded Time Telepsych consult ordered in [facility]:  No data recorded  Patient Reported  Information Reviewed? No data recorded Patient Left Without Being Seen? No data recorded Reason for Not Completing Assessment: No data recorded  Collateral Involvement: No data recorded  Does Patient Have a Court Appointed Legal Guardian? No data recorded Name and Contact of Legal Guardian: No data recorded If Minor and Not Living with Parent(s), Who has Custody? No data recorded Is CPS involved or ever been involved? Never  Is APS involved or ever been involved? Never   Patient Determined To Be At Risk for Harm To Self or Others Based on Review of Patient Reported Information or Presenting Complaint? Yes, for Self-Harm  Method: No data recorded Availability of Means: No data recorded Intent: No data recorded Notification Required: No data recorded Additional Information for Danger to Others Potential: No data recorded Additional Comments for Danger to Others Potential: No data recorded Are There Guns or Other Weapons in Your Home? No  Types of Guns/Weapons: No data recorded Are These Weapons Safely Secured?                            No data recorded Who Could Verify You Are Able To Have These Secured: No data recorded Do You Have any Outstanding Charges, Pending Court Dates, Parole/Probation? No data recorded Contacted To Inform of Risk of Harm To Self or Others: No data recorded  Location of Assessment: [facility] ED   Does Patient Present under Involuntary Commitment? Yes  IVC Papers Initial File Date: 08/09/22   [place] of Residence: [place]   Patient Currently Receiving the Following Services: No data recorded  Determination of Need: Emergent (2 hours)   Options For Referral: No data recorded    CCA Biopsychosocial Intake/Chief Complaint:  No data recorded Current Symptoms/Problems: No data recorded  Patient Reported Schizophrenia/Schizoaffective Diagnosis in Past: No   Strengths: Patient is able to communicate his needs  Preferences: No data  recorded Abilities: No data recorded  Type of Services Patient Feels are Needed: No data recorded  Initial Clinical Notes/Concerns: No data recorded  Mental Health Symptoms Depression:   Change in energy/activity; Difficulty Concentrating; Fatigue; Hopelessness; Worthlessness; Tearfulness   Duration of Depressive symptoms:  Greater than two weeks   Mania:   None   Anxiety:    Difficulty concentrating; Fatigue   Psychosis:   None   Duration of Psychotic symptoms: No data recorded  Trauma:   None   Obsessions:   None   Compulsions:   None   Inattention:   None   Hyperactivity/Impulsivity:   None   Oppositional/Defiant Behaviors:   None   Emotional Irregularity:   None   Other Mood/Personality Symptoms:  No data recorded   Mental Status Exam Appearance and self-care  Stature:   Average   Weight:   Average weight   Clothing:   Casual   Grooming:   Normal   Cosmetic use:   None   Posture/gait:   Normal   Motor activity:   Not Remarkable   Sensorium  Attention:   Normal   Concentration:   Normal   Orientation:  X5   Recall/memory:   Normal   Affect and Mood  Affect:   Appropriate   Mood:   Depressed   Relating  Eye contact:   Normal   Facial expression:   Depressed   Attitude toward examiner:   Cooperative   Thought and Language  Speech flow:  Clear and Coherent   Thought content:   Appropriate to Mood and Circumstances   Preoccupation:   None   Hallucinations:   None   Organization:  No data recorded  [employer] of Knowledge:   Fair   Intelligence:   Average   Abstraction:   Functional   Judgement:   Impaired   Reality Testing:   Adequate   Insight:   Fair   Decision Making:   Impulsive   Social Functioning  Social Maturity:   Impulsive   Social Judgement:   Heedless   Stress  Stressors:   Surveyor, quantity; Work   Coping Ability:   Contractor Deficits:    None   Supports:   Family     Religion: Religion/Spirituality Are You A Religious Person?: No  Leisure/Recreation: Leisure / Recreation Do You Have Hobbies?: No  Exercise/Diet: Exercise/Diet Do You Exercise?: No Have You Gained or Lost A Significant Amount of Weight in the Past Six Months?: No Do You Follow a Special Diet?: No Do You Have Any Trouble Sleeping?: No   CCA Employment/Education Employment/Work Situation: Employment / Work Situation Employment Situation: Employed Work Stressors: none reported Patient's Job has Been Impacted by Current Illness: No Has Patient ever Been in Equities trader?: No  Education: Education Is Patient Currently Attending School?: No Did You Have An Individualized Education Program (IIEP): No Did You Have Any Difficulty At [employer]?: No Patient's Education Has Been Impacted by Current Illness: No   CCA Family/Childhood History Family and Relationship History: Family history Marital status: Single Does patient have children?: Yes How many children?:  (unknown) How is patient's relationship with their children?: unknown  Childhood History:  Childhood History Did patient suffer any verbal/emotional/physical/sexual abuse as a child?: No Did patient suffer from severe childhood neglect?: No Has patient ever been sexually abused/assaulted/raped as an adolescent or adult?: No Was the patient ever a victim of a crime or a disaster?: No Witnessed domestic violence?: No Has patient been affected by domestic violence as an adult?: No  Child/Adolescent Assessment:     CCA Substance Use Alcohol/Drug Use: Alcohol / Drug Use Pain Medications: see mar Prescriptions: see mar Over the Counter: see mar History of alcohol / drug use?: Yes Substance #1 Name of Substance 1: Alcohol 1 - Age of First Use: 14 1 - Amount (size/oz): "40 oz beer and a few shots" 1 - Frequency: daily 1 - Duration: 28 years 1 - Last Use / Amount: 07/17/23 1 -  Method of Aquiring: purchasing 1- Route of Use: oral                       ASAM's:  Six Dimensions of Multidimensional Assessment  Dimension 1:  Acute Intoxication and/or Withdrawal Potential:      Dimension 2:  Biomedical Conditions and Complications:      Dimension 3:  Emotional, Behavioral, or Cognitive Conditions and Complications:     Dimension 4:  Readiness to Change:     Dimension 5:  Relapse, Continued use, or Continued Problem Potential:     Dimension 6:  Recovery/Living Environment:     ASAM Severity  Score:    ASAM Recommended Level of Treatment: ASAM Recommended Level of Treatment: Level III Residential Treatment   Substance use Disorder (SUD) Substance Use Disorder (SUD)  Checklist Symptoms of Substance Use: Continued use despite having a persistent/recurrent physical/psychological problem caused/exacerbated by use, Continued use despite persistent or recurrent social, interpersonal problems, caused or exacerbated by use, Persistent desire or unsuccessful efforts to cut down or control use, Large amounts of time spent to obtain, use or recover from the substance(s), Presence of craving or strong urge to use, Recurrent use that results in a failure to fulfill major role obligations (work, school, home), Repeated use in physically hazardous situations, Social, occupational, recreational activities given up or reduced due to use, Substance(s) often taken in larger amounts or over longer times than was intended  Recommendations for Services/Supports/Treatments:    DSM5 Diagnoses: Patient Active Problem List   Diagnosis Date Noted   Suicide attempt (HCC) 07/17/2023   MDD (major depressive disorder), recurrent episode, severe (HCC) 07/17/2023   Intentional drug overdose (HCC) 07/17/2023   Alcoholic intoxication without complication (HCC) 07/17/2023   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

## 2023-07-17 NOTE — Tx Team (Signed)
Initial Treatment Plan 07/17/2023 3:43 PM Christopher Hardin [id number]    PATIENT STRESSORS: Health problems   Legal issue   Substance abuse     PATIENT STRENGTHS: Ability for insight  Communication skills  General fund of knowledge  Motivation for treatment/growth  Special hobby/interest  Supportive family/friends    PATIENT IDENTIFIED PROBLEMS: "At risk for suicide"   "Substance use"   "Stress from work"   "Depression"   "Legal issues"              DISCHARGE CRITERIA:  Ability to meet basic life and health needs Improved stabilization in mood, thinking, and/or behavior Verbal commitment to aftercare and medication compliance Withdrawal symptoms are absent or subacute and managed without 24-hour nursing intervention  PRELIMINARY DISCHARGE PLAN: Attend 12-step recovery group Outpatient therapy Return to previous living arrangement Return to previous work or school arrangements  PATIENT/FAMILY INVOLVEMENT: This treatment plan has been presented to and reviewed with the patient, Christopher Hardin, and/or family member.  The patient and family have been given the opportunity to ask questions and make suggestions.  Tyrone Apple, RN 07/17/2023, 3:43 PM

## 2023-07-17 NOTE — ED Triage Notes (Signed)
First Nurse Note: Pt arrives via [facility] from home for an intentional OD on 30 Chlorthalidone (25mg ) and unknown amount of 25mg  of hydrochlorothiazide around 2330, 2 shots of liqour and a 40oz of beer. Pt stating to EMS "I'm tired of being tired".    VSS with EMS   Poison Control contacted by Consulting civil engineer.     Pt endorses SI and was trying to harm himself due to stress. Pt is endorses fatigue in triage stating he "drank some" too along with the consumption of prescription BP medications. Pt is cooperative in triage.

## 2023-07-17 NOTE — ED Notes (Signed)
Poison control updated and they stated they are closing out the case.

## 2023-07-18 DIAGNOSIS — F332 Major depressive disorder, recurrent severe without psychotic features: Secondary | ICD-10-CM | POA: Diagnosis not present

## 2023-07-18 DIAGNOSIS — F1020 Alcohol dependence, uncomplicated: Principal | ICD-10-CM

## 2023-07-18 LAB — CBC
HCT: 39.8 % (ref 39.0–52.0)
Hemoglobin: 13.4 g/dL (ref 13.0–17.0)
MCH: 27.9 pg (ref 26.0–34.0)
MCHC: 33.7 g/dL (ref 30.0–36.0)
MCV: 82.9 fL (ref 80.0–100.0)
Platelets: 249 K/uL (ref 150–400)
RBC: 4.8 MIL/uL (ref 4.22–5.81)
RDW: 14.4 % (ref 11.5–15.5)
WBC: 4.3 K/uL (ref 4.0–10.5)
nRBC: 0 % (ref 0.0–0.2)

## 2023-07-18 LAB — MAGNESIUM: Magnesium: 1.5 mg/dL — ABNORMAL LOW (ref 1.7–2.4)

## 2023-07-18 LAB — COMPREHENSIVE METABOLIC PANEL
ALT: 42 U/L (ref 0–44)
AST: 70 U/L — ABNORMAL HIGH (ref 15–41)
Albumin: 4.3 g/dL (ref 3.5–5.0)
Alkaline Phosphatase: 33 U/L — ABNORMAL LOW (ref 38–126)
Anion gap: 14 (ref 5–15)
BUN: 16 mg/dL (ref 6–20)
CO2: 28 mmol/L (ref 22–32)
Calcium: 10.1 mg/dL (ref 8.9–10.3)
Chloride: 93 mmol/L — ABNORMAL LOW (ref 98–111)
Creatinine, Ser: 0.98 mg/dL (ref 0.61–1.24)
GFR, Estimated: >60 mL/min (ref >60)
Glucose, Bld: 95 mg/dL (ref 70–99)
Potassium: 3.6 mmol/L (ref 3.5–5.1)
Sodium: 135 mmol/L (ref 135–145)
Total Bilirubin: 1.2 mg/dL (ref 0.3–1.2)
Total Protein: 8.8 g/dL — ABNORMAL HIGH (ref 6.5–8.1)

## 2023-07-18 LAB — LIPID PANEL
Cholesterol: 346 mg/dL — ABNORMAL HIGH (ref 0–200)
HDL: >135 mg/dL (ref >40)
Triglycerides: 45 mg/dL (ref <150)
VLDL: 9 mg/dL (ref 0–40)

## 2023-07-18 LAB — TSH: TSH: 3.239 u[IU]/mL (ref 0.350–4.500)

## 2023-07-18 LAB — HEMOGLOBIN A1C
Hgb A1c MFr Bld: 6.4 % — ABNORMAL HIGH (ref 4.8–5.6)
Mean Plasma Glucose: 136.98 mg/dL

## 2023-07-18 LAB — ETHANOL: Alcohol, Ethyl (B): <10 mg/dL (ref <10)

## 2023-07-18 MED ORDER — NALTREXONE HCL 50 MG PO TABS
25.0000 mg | ORAL_TABLET | Freq: Every day | ORAL | Status: AC
  Administered 2023-07-18: 25 mg via ORAL
  Filled 2023-07-18 (×2): qty 1

## 2023-07-18 MED ORDER — POTASSIUM CHLORIDE CRYS ER 20 MEQ PO TBCR
20.0000 meq | EXTENDED_RELEASE_TABLET | Freq: Once | ORAL | Status: AC
  Administered 2023-07-18: 20 meq via ORAL
  Filled 2023-07-18 (×2): qty 1

## 2023-07-18 MED ORDER — SERTRALINE HCL 25 MG PO TABS
25.0000 mg | ORAL_TABLET | Freq: Once | ORAL | Status: AC
  Administered 2023-07-18: 25 mg via ORAL
  Filled 2023-07-18: qty 1

## 2023-07-18 MED ORDER — SERTRALINE HCL 50 MG PO TABS
50.0000 mg | ORAL_TABLET | Freq: Every day | ORAL | Status: DC
  Administered 2023-07-19 – 2023-07-21 (×3): 50 mg via ORAL
  Filled 2023-07-18 (×4): qty 1

## 2023-07-18 MED ORDER — MAGNESIUM OXIDE -MG SUPPLEMENT 400 (240 MG) MG PO TABS: 400.0000 mg | ORAL_TABLET | Freq: Every day | ORAL | Status: DC

## 2023-07-18 MED ORDER — CLONIDINE HCL 0.1 MG PO TABS: 0.1000 mg | ORAL_TABLET | Freq: Four times a day (QID) | ORAL | Status: DC | PRN

## 2023-07-18 MED ORDER — MAGNESIUM OXIDE -MG SUPPLEMENT 400 (240 MG) MG PO TABS
400.0000 mg | ORAL_TABLET | Freq: Once | ORAL | Status: AC
  Administered 2023-07-18: 400 mg via ORAL
  Filled 2023-07-18 (×2): qty 1

## 2023-07-18 MED ORDER — NALTREXONE HCL 50 MG PO TABS
50.0000 mg | ORAL_TABLET | Freq: Every day | ORAL | Status: DC
  Administered 2023-07-19 – 2023-07-22 (×4): 50 mg via ORAL
  Filled 2023-07-18 (×6): qty 1

## 2023-07-18 MED ORDER — AMLODIPINE BESYLATE 5 MG PO TABS
5.0000 mg | ORAL_TABLET | Freq: Every day | ORAL | Status: DC
  Administered 2023-07-18 – 2023-07-23 (×6): 5 mg via ORAL
  Filled 2023-07-18 (×9): qty 1
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
## 2023-07-18 NOTE — Plan of Care (Signed)
  Problem: Education: Goal: Emotional status will improve Outcome: Progressing   Problem: Education: Goal: Mental status will improve Outcome: Progressing   Problem: Education: Goal: Verbalization of understanding the information provided will improve Outcome: Progressing   Problem: Activity: Goal: Sleeping patterns will improve Outcome: Progressing   Problem: Coping: Goal: Ability to verbalize frustrations and anger appropriately will improve Outcome: Progressing   Problem: Safety: Goal: Periods of time without injury will increase Outcome: Progressing

## 2023-07-18 NOTE — [facility] Suicide Risk Assessment (Signed)
[facility] INPATIENT:  Family/Significant Other Suicide Prevention Education  Suicide Prevention Education:  Education Completed; Christopher Hardin, mother, [phone], has been identified by the patient as the family member/significant other with whom the patient will be residing, and identified as the person(s) who will aid the patient in the event of a mental health crisis (suicidal ideations/suicide attempt).  With written consent from the patient, the family member/significant other has been provided the following suicide prevention education, prior to the and/or following the discharge of the patient.  The suicide prevention education provided includes the following: Suicide risk factors Suicide prevention and interventions National Suicide Hotline telephone number [facility] assessment telephone number [facility] Emergency Assistance 911 [facility] and/or Residential Mobile Crisis Unit telephone number  Request made of family/significant other to: Remove weapons (e.g., guns, rifles, knives), all items previously/currently identified as safety concern.  There are not guns in the home, per Christopher Hardin.  Remove drugs/medications (over-the-counter, prescriptions, illicit drugs), all items previously/currently identified as a safety concern.  The family member/significant other verbalizes understanding of the suicide prevention education information provided.  The family member/significant other agrees to remove the items of safety concern listed above.  Christopher Hardin reports that pt had "bad breakup" with his girlfriend and this is impacting his feeling depressed.  She came to stay with him to be a support, normally she stays with her oldest son.  Christopher Hardin is in treatment currently for ETOH with [facility] and she has spoken to [employer] about having Christopher Hardin admitted to the same treatment program and they are willing to do this: contact person is [place], [phone].  Christopher Hardin will be moving  into her own apartment mid-October, but will continue to be checking on pt and hopes to be a support as they both try to get away from the ETOH use.   Christopher Hardin 07/18/2023, 2:47 PM

## 2023-07-18 NOTE — [facility] Group Notes (Signed)
[facility] Group Notes:  (Nursing/MHT/Case Management/Adjunct)  Date:  07/18/2023  Time:  9:53 AM  Type of Therapy:   Goals group  Participation Level:  Active  Participation Quality:  Appropriate  Affect:  Appropriate  Cognitive:  Appropriate  Insight:  Appropriate  Engagement in Group:  Engaged  Modes of Intervention:  Discussion, Orientation, and Socialization  Summary of Progress/Problems: Goal is to get stress level to a 0  Azalee Course 07/18/2023, 9:53 AM
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
## 2023-07-18 NOTE — Plan of Care (Signed)
Problem: Activity: Goal: Sleeping patterns will improve Outcome: Progressing   Problem: Safety: Goal: Periods of time without injury will increase Outcome: Progressing

## 2023-07-18 NOTE — Progress Notes (Signed)
Patient rated his anxiety level 2/10 and his depression level 0/10 with 10 being the highest and 0 none. Medication and group compliant. Minimal interaction observed with peers. CIWA on shift= 3 . Appetite good on shift. Safety maintained.  07/18/23 0805  Psych Admission Type (Psych Patients Only)  Admission Status Involuntary  Psychosocial Assessment  Patient Complaints Anxiety;Substance abuse  Eye Contact Fair  Facial Expression Flat;Anxious  Affect Anxious;Depressed  Speech Logical/coherent  Interaction Assertive  Motor Activity Other (Comment) (WNL)  Appearance/Hygiene In scrubs  Behavior Characteristics Cooperative;Anxious  Mood Depressed;Anxious  Thought Process  Coherency WDL  Content WDL  Delusions None reported or observed  Perception WDL  Hallucination None reported or observed  Judgment Impaired  Confusion None  Danger to Self  Current suicidal ideation? Denies  Danger to Others  Danger to Others None reported or observed

## 2023-07-18 NOTE — BHH Group Notes (Signed)
[facility] Group Notes:  (Nursing/MHT/Case Management/Adjunct)  Date:  07/18/2023  Time:  2015  Type of Therapy:   Wrap up group  Participation Level:  Active  Participation Quality:  Appropriate, Attentive, Sharing, and Supportive  Affect:  Flat  Cognitive:  Alert  Insight:  Improving  Engagement in Group:  Engaged  Modes of Intervention:  Clarification, Education, and Support  Summary of Progress/Problems: Positive thinking and positive change were discussed.   Marcille Buffy 07/18/2023, 10:55 PM

## 2023-07-18 NOTE — Progress Notes (Signed)
   07/18/23 0559  15 Minute Checks  Location Bedroom  Visual Appearance Calm  Behavior Sleeping  Sleep (Behavioral Health Patients Only)  Calculate sleep? (Click Yes once per 24 hr at 0600 safety check) Yes  Documented sleep last 24 hours 8.25

## 2023-07-18 NOTE — Progress Notes (Signed)
   07/17/23 2230  Psych Admission Type (Psych Patients Only)  Admission Status Involuntary  Psychosocial Assessment  Patient Complaints Depression;Substance abuse  Eye Contact Fair  Facial Expression Flat  Affect Appropriate to circumstance;Depressed  Speech Logical/coherent  Interaction Assertive  Motor Activity Other (Comment) (wnl)  Appearance/Hygiene In scrubs  Behavior Characteristics Cooperative;Appropriate to situation  Mood Depressed;Pleasant  Thought Process  Coherency WDL  Content WDL  Delusions None reported or observed  Perception WDL  Hallucination None reported or observed  Judgment Impaired  Confusion None  Danger to Self  Current suicidal ideation? Denies

## 2023-07-18 NOTE — H&P (Signed)
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 restart if renal function does not worsen Replete mg, gave mag ox Replete K+ -- Kcl 20 mEq Encourage fluid intake   4. Discharge Planning:   -- Social work and case management to assist with discharge planning and identification of hospital follow-up needs prior to discharge  -- Estimated LOS: 5-7 days  -- Discharge Concerns: Need to establish a safety plan; Medication compliance and effectiveness  -- Discharge Goals: Return home with outpatient referrals for mental health follow-up including medication management/psychotherapy   I certify that inpatient services furnished  can reasonably be expected to improve the patient's condition.   This note was created using a voice recognition software as a result there may be grammatical errors inadvertently enclosed that do not reflect the nature of this encounter. Every attempt is made to correct such errors.   Signed: Dr. Liston Alba, MD PGY-2, Psychiatry Residency  9/22/20247:34 AM

## 2023-07-19 ENCOUNTER — Encounter (HOSPITAL_COMMUNITY): Payer: Self-pay

## 2023-07-19 DIAGNOSIS — F332 Major depressive disorder, recurrent severe without psychotic features: Secondary | ICD-10-CM

## 2023-07-19 LAB — COMPREHENSIVE METABOLIC PANEL
ALT: 36 U/L (ref 0–44)
AST: 49 U/L — ABNORMAL HIGH (ref 15–41)
Albumin: 4.3 g/dL (ref 3.5–5.0)
Alkaline Phosphatase: 35 U/L — ABNORMAL LOW (ref 38–126)
Anion gap: 13 (ref 5–15)
BUN: 16 mg/dL (ref 6–20)
CO2: 25 mmol/L (ref 22–32)
Calcium: 9.6 mg/dL (ref 8.9–10.3)
Chloride: 93 mmol/L — ABNORMAL LOW (ref 98–111)
Creatinine, Ser: 0.96 mg/dL (ref 0.61–1.24)
GFR, Estimated: >60 mL/min (ref >60)
Glucose, Bld: 115 mg/dL — ABNORMAL HIGH (ref 70–99)
Potassium: 3.4 mmol/L — ABNORMAL LOW (ref 3.5–5.1)
Sodium: 131 mmol/L — ABNORMAL LOW (ref 135–145)
Total Bilirubin: 1 mg/dL (ref 0.3–1.2)
Total Protein: 8.5 g/dL — ABNORMAL HIGH (ref 6.5–8.1)

## 2023-07-19 LAB — MAGNESIUM: Magnesium: 1.7 mg/dL (ref 1.7–2.4)

## 2023-07-19 MED ORDER — POTASSIUM CHLORIDE CRYS ER 20 MEQ PO TBCR
20.0000 meq | EXTENDED_RELEASE_TABLET | Freq: Once | ORAL | Status: AC
  Administered 2023-07-19: 20 meq via ORAL
  Filled 2023-07-19: qty 1

## 2023-07-19 MED ORDER — MAGNESIUM OXIDE -MG SUPPLEMENT 400 (240 MG) MG PO TABS
400.0000 mg | ORAL_TABLET | Freq: Once | ORAL | Status: AC
  Administered 2023-07-19: 400 mg via ORAL
  Filled 2023-07-19: qty 1

## 2023-07-19 NOTE — Group Note (Signed)

## 2023-07-19 NOTE — BH IP Treatment Plan (Signed)
Interdisciplinary Treatment and Diagnostic Plan Update  07/19/2023 Time of Session: 10:55am Christopher Hardin MRN: [id number]  Principal Diagnosis: Alcohol use disorder, severe, dependence (HCC)  Secondary Diagnoses: Principal Problem:   Alcohol use disorder, severe, dependence (HCC) Active Problems:   MDD (major depressive disorder), recurrent severe, without psychosis (HCC)   Current Medications:  Current Facility-Administered Medications  Medication Dose Route Frequency Provider Last Rate Last Admin   acetaminophen (TYLENOL) tablet 650 mg  650 mg Oral Q6H PRN Bing Neighbors, NP       alum & mag hydroxide-simeth (MAALOX/MYLANTA) 200-200-20 MG/5ML suspension 30 mL  30 mL Oral Q4H PRN Bing Neighbors, NP       amLODipine (NORVASC) tablet 5 mg  5 mg Oral Daily Carrion-Carrero, Margely, MD   5 mg at 07/19/23 0835   haloperidol (HALDOL) tablet 5 mg  5 mg Oral Q6H PRN Bing Neighbors, NP       And   benztropine (COGENTIN) tablet 1 mg  1 mg Oral Q6H PRN Bing Neighbors, NP       cloNIDine (CATAPRES) tablet 0.1 mg  0.1 mg Oral Q6H PRN Carrion-Carrero, Margely, MD       hydrOXYzine (ATARAX) tablet 25 mg  25 mg Oral Q6H PRN Bing Neighbors, NP       loperamide (IMODIUM) capsule 2-4 mg  2-4 mg Oral PRN Bing Neighbors, NP       LORazepam (ATIVAN) tablet 1 mg  1 mg Oral Q6H PRN Bing Neighbors, NP       magnesium hydroxide (MILK OF MAGNESIA) suspension 30 mL  30 mL Oral Daily PRN Bing Neighbors, NP       magnesium oxide (MAG-OX) tablet 400 mg  400 mg Oral Once [name], MD       multivitamin with minerals tablet 1 tablet  1 tablet Oral Daily Bing Neighbors, NP   1 tablet at 07/19/23 0835   naltrexone (DEPADE) tablet 50 mg  50 mg Oral QHS Attiah, Nadir, MD       ondansetron (ZOFRAN-ODT) disintegrating tablet 4 mg  4 mg Oral Q6H PRN Bing Neighbors, NP       potassium chloride SA (KLOR-CON M) CR tablet 20 mEq  20 mEq Oral Once [name], MD       sertraline (ZOLOFT) tablet 50 mg  50 mg Oral Daily Carrion-Carrero, Margely, MD   50 mg at 07/19/23 4098   thiamine (VITAMIN B1) tablet 100 mg  100 mg Oral Daily Bing Neighbors, NP   100 mg at 07/19/23 0835   traZODone (DESYREL) tablet 50 mg  50 mg Oral QHS PRN Bobbitt, Shalon E, NP   50 mg at 07/18/23 2210   PTA Medications: Medications Prior to Admission  Medication Sig Dispense Refill Last Dose   chlorthalidone (HYGROTON) 25 MG tablet Take 25 mg by mouth daily.       Patient Stressors: Health problems   Legal issue   Substance abuse    Patient Strengths: Ability for insight  Communication skills  General fund of knowledge  Motivation for treatment/growth  Special hobby/interest  Supportive family/friends   Treatment Modalities: Medication Management, Group therapy, Case management,  1 to 1 session with clinician, Psychoeducation, Recreational therapy.   Physician Treatment Plan for Primary Diagnosis: Alcohol use disorder, severe, dependence (HCC) Long Term Goal(s): Improvement in symptoms so as ready for discharge   Short Term Goals: Ability to identify changes in lifestyle to  reduce recurrence of condition will improve Ability to verbalize feelings will improve  Medication Management: Evaluate patient's response, side effects, and tolerance of medication regimen.  Therapeutic Interventions: 1 to 1 sessions, Unit Group sessions and Medication administration.  Evaluation of Outcomes: Progressing  Physician Treatment Plan for Secondary Diagnosis: Principal Problem:   Alcohol use disorder, severe, dependence (HCC) Active Problems:   MDD (major depressive disorder), recurrent severe, without psychosis (HCC)  Long Term Goal(s): Improvement in symptoms so as ready for discharge   Short Term Goals: Ability to identify changes in lifestyle to reduce recurrence of condition will improve Ability to verbalize feelings will improve     Medication Management:  Evaluate patient's response, side effects, and tolerance of medication regimen.  Therapeutic Interventions: 1 to 1 sessions, Unit Group sessions and Medication administration.  Evaluation of Outcomes: Progressing   RN Treatment Plan for Primary Diagnosis: Alcohol use disorder, severe, dependence (HCC) Long Term Goal(s): Knowledge of disease and therapeutic regimen to maintain health will improve  Short Term Goals: Ability to remain free from injury will improve, Ability to verbalize frustration and anger appropriately will improve, Ability to participate in decision making will improve, Ability to verbalize feelings will improve, Ability to identify and develop effective coping behaviors will improve, and Compliance with prescribed medications will improve  Medication Management: RN will administer medications as ordered by provider, will assess and evaluate patient's response and provide education to patient for prescribed medication. RN will report any adverse and/or side effects to prescribing provider.  Therapeutic Interventions: 1 on 1 counseling sessions, Psychoeducation, Medication administration, Evaluate responses to treatment, Monitor vital signs and CBGs as ordered, Perform/monitor CIWA, COWS, AIMS and Fall Risk screenings as ordered, Perform wound care treatments as ordered.  Evaluation of Outcomes: Progressing   LCSW Treatment Plan for Primary Diagnosis: Alcohol use disorder, severe, dependence (HCC) Long Term Goal(s): Safe transition to appropriate next level of care at discharge, Engage patient in therapeutic group addressing interpersonal concerns.  Short Term Goals: Engage patient in aftercare planning with referrals and resources, Increase social support, Increase emotional regulation, Facilitate acceptance of mental health diagnosis and concerns, Identify triggers associated with mental health/substance abuse issues, and Increase skills for wellness and  recovery  Therapeutic Interventions: Assess for all discharge needs, 1 to 1 time with Social worker, Explore available resources and support systems, Assess for adequacy in community support network, Educate family and significant other(s) on suicide prevention, Complete Psychosocial Assessment, Interpersonal group therapy.  Evaluation of Outcomes: Progressing   Progress in Treatment: Attending groups: Yes. Participating in groups: Yes. Taking medication as prescribed: Yes. Toleration medication: Yes. Family/Significant other contact made: Yes, individual(s) contacted:  Kathline Magic, mother.  [phone] Patient understands diagnosis: Yes. Discussing patient identified problems/goals with staff: Yes. Medical problems stabilized or resolved: No. Denies suicidal/homicidal ideation: Yes. Issues/concerns per patient self-inventory: No.  New problem(s) identified: No, Describe:  none reported  New Short Term/Long Term Goal(s):detox, medication management for mood stabilization; elimination of SI thoughts; development of comprehensive mental wellness/sobriety plan   Patient Goals:  "stop drink and get some information about AA and outpatient treatment options"  Discharge Plan or Barriers: Patient recently admitted. CSW will continue to follow and assess for appropriate referrals and possible discharge planning.    Reason for Continuation of Hospitalization: Depression Medication stabilization Suicidal ideation  Estimated Length of Stay:5-7 days  Last 3 [place] Suicide Severity Risk Score: Flowsheet Row Ad[facility] (Current[facility] Most recent reading at  07/18/2023 12:54 AM ED from 07/17/2023 in [facility] Emergency Department at [facility] Most recent reading at 07/17/2023  1:36 AM ED from 06/17/2023 in [facility] Emergency Department at [facility] Most recent reading at 06/17/2023  9:13 AM  C-SSRS RISK CATEGORY  Error: Q7 should not be populated when Q6 is No High Risk No Risk       Last PHQ 2/9 Scores:     No data to display          Scribe for Treatment Team: Izell [place], [place] 07/19/2023 12:27 PM

## 2023-07-19 NOTE — Plan of Care (Signed)

## 2023-07-19 NOTE — Group Note (Signed)
Recreation Therapy Group Note   Group Topic:Stress Management  Group Date: 07/19/2023 Start Time: 0935 End Time: 0950 Facilitators: Dorisann Schwanke-McCall, LRT,CTRS Location: [address] Dayroom   Group Topic: Stress Management   Goal Area(s) Addresses:  Patient will actively participate in stress management techniques presented during session.  Patient will successfully identify benefit of practicing stress management post d/c.   Intervention: Relaxation exercise with ambient sound and script   Group Description: Guided Imagery. LRT provided education, instruction, and demonstration on practice of visualization via guided imagery. Patient was asked to participate in the technique introduced during session. LRT debriefed including topics of mindfulness, stress management and specific scenarios each patient could use these techniques. Patients were given suggestions of ways to access scripts post d/c and encouraged to explore Youtube and other apps available on smartphones, tablets, and computers.  Education:  Stress Management, Discharge Planning.   Education Outcome: Acknowledges education   Affect/Mood: N/A   Participation Level: Did not attend    Clinical Observations/Individualized Feedback:     Plan: Continue to engage patient in RT group sessions 2-3x/week.   Uriel Dowding-McCall,  LRT, CTRS 07/19/2023 12:16 PM
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
## 2023-07-19 NOTE — Plan of Care (Signed)
Problem: Education: Goal: Mental status will improve Outcome: Progressing   Problem: Activity: Goal: Interest or engagement in activities will improve Outcome: Progressing

## 2023-07-19 NOTE — Progress Notes (Signed)
   07/19/23 0900  Psych Admission Type (Psych Patients Only)  Admission Status Involuntary  Psychosocial Assessment  Patient Complaints Substance abuse  Eye Contact Fair  Facial Expression Animated  Affect Appropriate to circumstance  Speech Logical/coherent  Interaction Assertive  Motor Activity Other (Comment) (wnl)  Appearance/Hygiene In scrubs  Behavior Characteristics Appropriate to situation  Mood Depressed;Pleasant  Thought Process  Coherency WDL  Content WDL  Delusions None reported or observed  Perception WDL  Hallucination None reported or observed  Judgment Impaired  Confusion None  Danger to Self  Current suicidal ideation? Denies  Danger to Others  Danger to Others None reported or observed   Pt minimizing symptoms this morning. Pt states his suicide attempts was not from depression, just stress. Pt noted with bilateral hand tremors otherwise denies symptoms of withdrawal. Gatorade offered to pt, fluids encouraged. Will monitor.
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
## 2023-07-19 NOTE — Progress Notes (Signed)
Psychoeducational Group Note  Date:  07/19/2023 Time:  2158  Group Topic/Focus:  Wrap-Up Group:   The focus of this group is to help patients review their daily goal of treatment and discuss progress on daily workbooks.  Participation Level: Did Not Attend  Participation Quality:  Not Applicable  Affect:  Not Applicable  Cognitive:  Not Applicable  Insight:  Not Applicable  Engagement in Group: Not Applicable  Additional Comments:  The patient did not attend group this evening.   Hazle Coca S 07/19/2023, 9:58 PM

## 2023-07-19 NOTE — [facility] Group Notes (Signed)
Spiritual care group on grief and loss facilitated by Chaplain Dyanne Carrel, [facility]  Group Goal: Support / Education around grief and loss  Members engage in facilitated group support and psycho-social education.  Group Description:  Following introductions and group rules, group members engaged in facilitated group dialogue and support around topic of loss, with particular support around experiences of loss in their lives. Group Identified types of loss (relationships / self / things) and identified patterns, circumstances, and changes that precipitate losses. Reflected on thoughts / feelings around loss, normalized grief responses, and recognized variety in grief experience. Group encouraged individual reflection on safe space and on the coping skills that they are already utilizing.  Group drew on Adlerian / Rogerian and narrative framework  Patient Progress: Christopher Hardin attended group and actively engaged and participated in group conversation and activities.  His comments demonstrated good insight and contributed positively to the conversation. He shared about the loss of an aunt recently.

## 2023-07-19 NOTE — Progress Notes (Signed)
   07/18/23 2200  Psych Admission Type (Psych Patients Only)  Admission Status Involuntary  Psychosocial Assessment  Patient Complaints Substance abuse  Eye Contact Fair  Facial Expression Flat  Affect Appropriate to circumstance;Depressed  Speech Logical/coherent  Interaction Assertive  Motor Activity Other (Comment) (wnl)  Appearance/Hygiene In scrubs  Behavior Characteristics Appropriate to situation;Cooperative  Mood Depressed;Pleasant  Thought Process  Coherency WDL  Content WDL  Delusions None reported or observed  Perception WDL  Hallucination None reported or observed  Judgment Impaired  Confusion None  Danger to Self  Current suicidal ideation? Denies

## 2023-07-20 DIAGNOSIS — F332 Major depressive disorder, recurrent severe without psychotic features: Secondary | ICD-10-CM | POA: Diagnosis not present

## 2023-07-20 LAB — COMPREHENSIVE METABOLIC PANEL
ALT: 37 U/L (ref 0–44)
AST: 49 U/L — ABNORMAL HIGH (ref 15–41)
Albumin: 4.8 g/dL (ref 3.5–5.0)
Alkaline Phosphatase: 34 U/L — ABNORMAL LOW (ref 38–126)
Anion gap: 14 (ref 5–15)
BUN: 14 mg/dL (ref 6–20)
CO2: 22 mmol/L (ref 22–32)
Calcium: 9.9 mg/dL (ref 8.9–10.3)
Chloride: 95 mmol/L — ABNORMAL LOW (ref 98–111)
Creatinine, Ser: 0.94 mg/dL (ref 0.61–1.24)
GFR, Estimated: >60 mL/min (ref >60)
Glucose, Bld: 131 mg/dL — ABNORMAL HIGH (ref 70–99)
Potassium: 3.3 mmol/L — ABNORMAL LOW (ref 3.5–5.1)
Sodium: 131 mmol/L — ABNORMAL LOW (ref 135–145)
Total Bilirubin: 1.2 mg/dL (ref 0.3–1.2)
Total Protein: 8.8 g/dL — ABNORMAL HIGH (ref 6.5–8.1)

## 2023-07-20 LAB — MAGNESIUM: Magnesium: 1.9 mg/dL (ref 1.7–2.4)

## 2023-07-20 MED ORDER — POTASSIUM CHLORIDE CRYS ER 20 MEQ PO TBCR
20.0000 meq | EXTENDED_RELEASE_TABLET | Freq: Two times a day (BID) | ORAL | Status: AC
  Administered 2023-07-20 (×2): 20 meq via ORAL
  Filled 2023-07-20 (×2): qty 1

## 2023-07-20 NOTE — Progress Notes (Signed)
   07/19/23 2120  Psych Admission Type (Psych Patients Only)  Admission Status Involuntary  Psychosocial Assessment  Patient Complaints Substance abuse  Eye Contact Fair  Facial Expression Animated  Affect Appropriate to circumstance  Speech Logical/coherent  Interaction Assertive  Motor Activity Other (Comment) (WDL)  Appearance/Hygiene Unremarkable  Behavior Characteristics Cooperative;Appropriate to situation  Mood Depressed;Pleasant  Thought Process  Coherency WDL  Content WDL  Delusions None reported or observed  Perception WDL  Hallucination None reported or observed  Judgment Impaired  Confusion None  Danger to Self  Current suicidal ideation? Denies  Agreement Not to Harm Self Yes  Description of Agreement verbal  Danger to Others  Danger to Others None reported or observed

## 2023-07-20 NOTE — Plan of Care (Signed)
  Problem: Education: Goal: Knowledge of [facility] Education information/materials will improve Outcome: Progressing Goal: Emotional status will improve Outcome: Progressing Goal: Mental status will improve Outcome: Progressing Goal: Verbalization of understanding the information provided will improve Outcome: Progressing   

## 2023-07-20 NOTE — Group Note (Signed)
Recreation Therapy Group Note   Group Topic:Animal Assisted Therapy   Group Date: 07/20/2023 Start Time: 0945 End Time: 1030 Facilitators: Christopher Hardin, LRT,CTRS Location: [address] Dayroom   Animal-Assisted Activity (AAA) Program Checklist/Progress Notes Patient Eligibility Criteria Checklist & Daily Group note for Rec Tx Intervention  AAA/T Program Assumption of Risk Form signed by Patient/ or Parent Legal Guardian Yes  Patient understands his/her participation is voluntary Yes   Affect/Mood: N/A   Participation Level: Did not attend    Clinical Observations/Individualized Feedback:     Plan: Continue to engage patient in RT group sessions 2-3x/week.   Christopher Hardin, LRT,CTRS 07/20/2023 1:29 PM

## 2023-07-20 NOTE — Progress Notes (Signed)
   07/20/23 0600  15 Minute Checks  Location Dayroom  Visual Appearance Calm  Behavior Composed  Sleep (Behavioral Health Patients Only)  Calculate sleep? (Click Yes once per 24 hr at 0600 safety check) Yes  Documented sleep last 24 hours 7.5
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
## 2023-07-20 NOTE — Progress Notes (Signed)
   07/20/23 1800  Psych Admission Type (Psych Patients Only)  Admission Status Involuntary  Psychosocial Assessment  Patient Complaints Substance abuse  Eye Contact Fair  Facial Expression Pensive  Affect Appropriate to circumstance  Speech Logical/coherent  Interaction Assertive  Motor Activity Other (Comment) (appropriate)  Appearance/Hygiene Unremarkable  Behavior Characteristics Cooperative;Appropriate to situation  Mood Depressed;Pleasant  Thought Process  Coherency WDL  Content WDL  Delusions None reported or observed  Perception WDL  Hallucination None reported or observed  Judgment Limited  Confusion None  Danger to Self  Current suicidal ideation? Denies  Agreement Not to Harm Self Yes  Description of Agreement verbal  Danger to Others  Danger to Others None reported or observed

## 2023-07-20 NOTE — Group Note (Signed)
LCSW Group Therapy Note  Group Date: 07/20/2023 Start Time: 1100 End Time: 1145   Type of Therapy and Topic:  Group Therapy - How To Cope with Nervousness about Discharge   Participation Level:  Did Not Attend   Description of Group This process group involved identification of patients' feelings about discharge. Some of them are scheduled to be discharged soon, while others are new admissions, but each of them was asked to share thoughts and feelings surrounding discharge from the hospital. One common theme was that they are excited at the prospect of going home, while another was that many of them are apprehensive about sharing why they were hospitalized. Patients were given the opportunity to discuss these feelings with their peers in preparation for discharge.  Therapeutic Goals  Patient will identify their overall feelings about pending discharge. Patient will think about how they might proactively address issues that they believe will once again arise once they get home (i.e. with parents). Patients will participate in discussion about having hope for change.    Therapeutic Modalities Cognitive Behavioral Therapy   Ane Payment, LCSW 07/20/2023  1:30 PM

## 2023-07-21 DIAGNOSIS — F332 Major depressive disorder, recurrent severe without psychotic features: Secondary | ICD-10-CM | POA: Diagnosis not present

## 2023-07-21 LAB — COMPREHENSIVE METABOLIC PANEL
ALT: 35 U/L (ref 0–44)
AST: 48 U/L — ABNORMAL HIGH (ref 15–41)
Albumin: 4.6 g/dL (ref 3.5–5.0)
Alkaline Phosphatase: 34 U/L — ABNORMAL LOW (ref 38–126)
Anion gap: 13 (ref 5–15)
BUN: 13 mg/dL (ref 6–20)
CO2: 26 mmol/L (ref 22–32)
Calcium: 9.9 mg/dL (ref 8.9–10.3)
Chloride: 95 mmol/L — ABNORMAL LOW (ref 98–111)
Creatinine, Ser: 1.01 mg/dL (ref 0.61–1.24)
GFR, Estimated: >60 mL/min (ref >60)
Glucose, Bld: 116 mg/dL — ABNORMAL HIGH (ref 70–99)
Potassium: 3.9 mmol/L (ref 3.5–5.1)
Sodium: 134 mmol/L — ABNORMAL LOW (ref 135–145)
Total Bilirubin: 1.1 mg/dL (ref 0.3–1.2)
Total Protein: 8.4 g/dL — ABNORMAL HIGH (ref 6.5–8.1)

## 2023-07-21 MED ORDER — SERTRALINE HCL 100 MG PO TABS
100.0000 mg | ORAL_TABLET | Freq: Every day | ORAL | Status: DC
  Administered 2023-07-22 – 2023-07-23 (×2): 100 mg via ORAL
  Filled 2023-07-21 (×4): qty 1

## 2023-07-21 NOTE — Group Note (Signed)
Recreation Therapy Group Note   Group Topic:Communication  Group Date: 07/21/2023 Start Time: 0932 End Time: 1005 Facilitators: Ethal Gotay-McCall, LRT,CTRS Location: [address] Day[address]   Group Topic: Communication, Problem Solving   Goal Area(s) Addresses:  Patient will effectively listen to complete activity.  Patient will identify communication skills used to make activity successful.  Patient will identify how skills used during activity can be used to reach post d/c goals.    Intervention: Building surveyor Activity - Geometric pattern cards, pencils, blank paper    Group Description: Geometric Drawings.  Three volunteers from the peer group will be shown an abstract picture with a particular arrangement of geometrical shapes.  Each round, one 'speaker' will describe the pattern, as accurately as possible without revealing the image to the group.  The remaining group members will listen and draw the picture to reflect how it is described to them. Patients with the role of 'listener' cannot ask clarifying questions but, may request that the speaker repeat a direction. Once the drawings are complete, the presenter will show the rest of the group the picture and compare how close each person came to drawing the picture. LRT will facilitate a post-activity discussion regarding effective communication and the importance of planning, listening, and asking for clarification in daily interactions with others.  Education: Environmental consultant, Active listening, Support systems, Discharge planning  Education Outcome: Acknowledges understanding/In group clarification offered/Needs additional education.    Affect/Mood: N/A   Participation Level: Did not attend    Clinical Observations/Individualized Feedback:     Plan: Continue to engage patient in RT group sessions 2-3x/week.   Masoud Nyce-McCall, LRT,CTRS  07/21/2023 12:55 PM

## 2023-07-21 NOTE — [facility] Group Notes (Signed)
Adult Psychoeducational Group Note  Date:  07/21/2023 Time:  4:13 AM  Group Topic/Focus:  Wrap-Up Group:   The focus of this group is to help patients review their daily goal of treatment and discuss progress on daily workbooks.  Participation Level:  Active  Participation Quality:  Appropriate  Affect:  Appropriate  Cognitive:  Appropriate  Insight: Appropriate  Engagement in Group:  Engaged  Modes of Intervention:  Support  Additional Comments:  Pt attend group today, and rated today a 8 out of 10. Pt stated that his coping skills is working out playing basketball. Pt said communication help him as well.   Satira Anis 07/21/2023, 4:13 AM

## 2023-07-21 NOTE — Progress Notes (Signed)
Adult Psychoeducational Group Note  Date:  07/21/2023 Time:  9:47 PM  Group Topic/Focus:  Wrap-Up Group:   The focus of this group is to help patients review their daily goal of treatment and discuss progress on daily workbooks.  Participation Level:  Active  Participation Quality:  Appropriate  Affect:  Appropriate  Cognitive:  Appropriate  Insight: Appropriate  Engagement in Group:  Engaged  Modes of Intervention:  Activity  Additional Comments:  Pt attended and participated in NA group.  Christopher Hardin Katrinka Blazing 07/21/2023, 9:47 PM

## 2023-07-21 NOTE — [facility] Group Notes (Signed)
Group Topic/Focus:  Goals Group:   The focus of this group is to help patients establish daily goals to achieve during treatment and discuss how the patient can incorporate goal setting into their daily lives to aide in recovery.       Participation Level:  Active   Participation Quality:  Attentive   Affect:  Appropriate   Cognitive:  Appropriate   Insight: Appropriate   Engagement in Group:  Engaged   Modes of Intervention:  Discussion   Additional Comments:   Patient attended goals group and was attentive the duration of it. Patient's goal was is to work on potassium .Pt rated his morning an 10. Pt has no feelings of wanting to hurt himself or others.
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
## 2023-07-21 NOTE — Progress Notes (Signed)
   07/21/23 2440  15 Minute Checks  Location Bedroom  Visual Appearance Calm  Behavior Sleeping  Sleep (Behavioral Health Patients Only)  Calculate sleep? (Click Yes once per 24 hr at 0600 safety check) Yes  Documented sleep last 24 hours 8

## 2023-07-21 NOTE — [facility] Group Notes (Signed)
Spirituality group facilitated by Kathleen Argue, B[facility].  Group Description: Group focused on topic of hope. Patients participated in facilitated discussion around topic, connecting with one another around experiences and definitions for hope. Group members engaged with visual explorer photos, reflecting on what hope looks like for them today. Group engaged in discussion around how their definitions of hope are present today in hospital.  Modalities: Psycho-social ed, Adlerian, Narrative, MI  Patient Progress: Christopher Hardin attended group and actively engaged and participated in group conversation and activities.  His comments demonstrated good insight and contributed positively to the conversation.  He was supportive of peers.

## 2023-07-21 NOTE — Progress Notes (Addendum)
   07/20/23 2122  Psych Admission Type (Psych Patients Only)  Admission Status Involuntary  Psychosocial Assessment  Patient Complaints Substance abuse  Eye Contact Fair  Facial Expression Animated  Affect Appropriate to circumstance  Speech Logical/coherent  Interaction Assertive  Motor Activity Other (Comment) (WDL)  Appearance/Hygiene Unremarkable  Behavior Characteristics Cooperative;Appropriate to situation  Mood Depressed;Pleasant  Thought Process  Coherency WDL  Content WDL  Delusions None reported or observed  Perception WDL  Hallucination None reported or observed  Judgment Impaired  Confusion None  Danger to Self  Current suicidal ideation? Denies  Agreement Not to Harm Self Yes  Description of Agreement verbal  Danger to Others  Danger to Others None reported or observed

## 2023-07-21 NOTE — Progress Notes (Signed)
   07/21/23 1000  Psych Admission Type (Psych Patients Only)  Admission Status Involuntary  Psychosocial Assessment  Patient Complaints Substance abuse  Eye Contact Fair  Facial Expression Animated  Affect Appropriate to circumstance  Speech Logical/coherent  Interaction Assertive  Motor Activity Other (Comment) (WNL)  Appearance/Hygiene Unremarkable  Behavior Characteristics Cooperative;Appropriate to situation  Mood Depressed;Pleasant  Thought Process  Coherency WDL  Content WDL  Delusions None reported or observed  Perception WDL  Hallucination None reported or observed  Judgment Impaired  Confusion None  Danger to Self  Current suicidal ideation? Denies  Agreement Not to Harm Self Yes  Description of Agreement verbal  Danger to Others  Danger to Others None reported or observed

## 2023-07-21 NOTE — Plan of Care (Signed)
Problem: Activity: Goal: Interest or engagement in activities will improve Outcome: Progressing Goal: Sleeping patterns will improve Outcome: Progressing   Problem: Coping: Goal: Ability to verbalize frustrations and anger appropriately will improve Outcome: Progressing   Problem: Safety: Goal: Periods of time without injury will increase Outcome: Progressing

## 2023-07-22 DIAGNOSIS — F332 Major depressive disorder, recurrent severe without psychotic features: Secondary | ICD-10-CM | POA: Diagnosis not present

## 2023-07-22 NOTE — Progress Notes (Signed)
   07/22/23 1400  Psych Admission Type (Psych Patients Only)  Admission Status Involuntary  Psychosocial Assessment  Patient Complaints Substance abuse  Eye Contact Fair  Facial Expression Animated  Affect Appropriate to circumstance  Speech Logical/coherent  Interaction Assertive  Motor Activity Other (Comment)  Appearance/Hygiene Unremarkable  Behavior Characteristics Cooperative  Mood Pleasant  Thought Process  Coherency WDL  Content WDL  Delusions None reported or observed  Perception WDL  Hallucination None reported or observed  Judgment Impaired  Confusion None  Danger to Self  Current suicidal ideation? Denies  Danger to Others  Danger to Others None reported or observed

## 2023-07-22 NOTE — Plan of Care (Signed)
Problem: Education: Goal: Knowledge of [facility] Education information/materials will improve Outcome: Progressing   Problem: Education: Goal: Mental status will improve Outcome: Progressing   Problem: Activity: Goal: Sleeping patterns will improve Outcome: Progressing   Problem: Coping: Goal: Ability to verbalize frustrations and anger appropriately will improve Outcome: Progressing   Problem: Safety: Goal: Periods of time without injury will increase Outcome: Progressing
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
## 2023-07-22 NOTE — Progress Notes (Signed)
   07/22/23 0630  15 Minute Checks  Location Dayroom  Visual Appearance Calm  Behavior Composed  Sleep (Behavioral Health Patients Only)  Calculate sleep? (Click Yes once per 24 hr at 0600 safety check) Yes  Documented sleep last 24 hours 7.75

## 2023-07-22 NOTE — Plan of Care (Signed)

## 2023-07-22 NOTE — BHH Group Notes (Signed)
Adult Psychoeducational Group Note  Date:  07/22/2023 Time:  9:59 PM  Group Topic/Focus:  Wrap-Up Group:   The focus of this group is to help patients review their daily goal of treatment and discuss progress on daily workbooks.  Participation Level:  Active  Participation Quality:  Appropriate  Affect:  Appropriate  Cognitive:  Appropriate  Insight: Appropriate  Engagement in Group:  Engaged  Modes of Intervention:  Discussion  Additional Comments:  Patient attended and participated in Wrap-up group.  [name] 07/22/2023, 9:59 PM

## 2023-07-22 NOTE — Progress Notes (Signed)
   07/21/23 2115  Psych Admission Type (Psych Patients Only)  Admission Status Involuntary  Psychosocial Assessment  Patient Complaints Substance abuse  Eye Contact Fair  Facial Expression Animated  Affect Appropriate to circumstance  Speech Logical/coherent  Interaction Assertive  Motor Activity Other (Comment)  Appearance/Hygiene Unremarkable  Behavior Characteristics Cooperative;Appropriate to situation  Mood Depressed;Pleasant  Thought Process  Coherency WDL  Content WDL  Delusions None reported or observed  Perception WDL  Hallucination None reported or observed  Judgment Impaired  Confusion None  Danger to Self  Current suicidal ideation? Denies  Agreement Not to Harm Self Yes  Description of Agreement Verbal  Danger to Others  Danger to Others None reported or observed

## 2023-07-22 NOTE — Group Note (Signed)
LCSW Group Therapy Note   Group Date: 07/22/2023 Start Time: 1100 End Time: 1200   Type of Therapy and Topic: Group Therapy: Relationship Check-Ins   Participation Level: Active   Description Group:  In this group patients are encouraged to rate how well or not well they are able to improve their relationships in Beliefs and Values, Communication, Family and Friends, Actuary and Household, and Intimacy. Patients will be able to discuss and identify what is going well within these aspects and what is not. Patients will be able to find appropriate ways, solutions, and skills that will help them within the selected relationships category. Patients will be encouraged to share and reflect on why things within their relationships are not going so well and get feedback from the instructor or their peers.  This group will be solution focused and process-oriented with patients' participation in sharing and listening to their own and peers experience; along with receive support and advice on how to improve or change the circumstance that is known to be challenging in their relationships category (Beliefs and Values, Communication, Family and Friends, Actuary and Household, and Intimacy).   Therapeutic Goals:  Patient will identify their strengths and weakness within their Beliefs and Values, Communication, Family and Friends, Actuary and Household, and Intimacy relationships. Patient will identify reasons why and how they can improve their relationships.  Patient will identify how they can be supportive and honest to themselves and in their relationships.  Patient will be able to gain support and give support to others with similar challenges.   Summary of Patient Progress Pt was active in group   Therapeutic Modalities:  Solution Focused Therapy Cognitive Behavioral Therapy  Psychodynamic Therapy  Dialectical Behavior Therapy   Izell [place], LCSW 07/22/2023  12:48 PM

## 2023-07-23 DIAGNOSIS — F332 Major depressive disorder, recurrent severe without psychotic features: Secondary | ICD-10-CM | POA: Diagnosis not present

## 2023-07-23 LAB — COMPREHENSIVE METABOLIC PANEL
ALT: 32 U/L (ref 0–44)
AST: 40 U/L (ref 15–41)
Albumin: 4.3 g/dL (ref 3.5–5.0)
Alkaline Phosphatase: 34 U/L — ABNORMAL LOW (ref 38–126)
Anion gap: 9 (ref 5–15)
BUN: 12 mg/dL (ref 6–20)
CO2: 28 mmol/L (ref 22–32)
Calcium: 9.6 mg/dL (ref 8.9–10.3)
Chloride: 97 mmol/L — ABNORMAL LOW (ref 98–111)
Creatinine, Ser: 0.96 mg/dL (ref 0.61–1.24)
GFR, Estimated: >60 mL/min (ref >60)
Glucose, Bld: 95 mg/dL (ref 70–99)
Potassium: 3.6 mmol/L (ref 3.5–5.1)
Sodium: 134 mmol/L — ABNORMAL LOW (ref 135–145)
Total Bilirubin: 0.9 mg/dL (ref 0.3–1.2)
Total Protein: 8.1 g/dL (ref 6.5–8.1)

## 2023-07-23 MED ORDER — NALTREXONE HCL 50 MG PO TABS: 50.0000 mg | ORAL_TABLET | Freq: Every day | ORAL | 0 refills | Status: AC

## 2023-07-23 MED ORDER — AMLODIPINE BESYLATE 5 MG PO TABS: 5.0000 mg | ORAL_TABLET | Freq: Every day | ORAL | 0 refills | Status: AC

## 2023-07-23 MED ORDER — SERTRALINE HCL 100 MG PO TABS: 100.0000 mg | ORAL_TABLET | Freq: Every day | ORAL | 0 refills | Status: DC

## 2023-07-23 MED ORDER — TRAZODONE HCL 50 MG PO TABS: 50.0000 mg | ORAL_TABLET | Freq: Every evening | ORAL | 0 refills | Status: DC | PRN

## 2023-07-23 NOTE — BHH Suicide Risk Assessment (Signed)
[facility] Discharge Suicide Risk Assessment   Principal Problem: MDD (major depressive disorder), recurrent severe, without psychosis (HCC) Discharge Diagnoses: Principal Problem:   MDD (major depressive disorder), recurrent severe, without psychosis (HCC) Active Problems:   Alcohol use disorder, severe, dependence (HCC)   Total Time spent with patient: 20 minutes   Christopher Hardin is a 42 yr old male who presented on 9/21 to [facility] due to OD (30 tablets of 25 mg Chlorthalidone), he was admitted to [facility] on 9/22.     During the patient's hospitalization, patient had extensive initial psychiatric evaluation, and follow-up psychiatric evaluations every day.   Psychiatric diagnoses provided upon initial assessment:  MDD Alcohol use disorder    Patient's psychiatric medications were adjusted on admission:  Start Zoloft 25 mg daily for depression, with plans to increase to 50 mg starting 9/23 Consider addition of naltrexone for management of cravings/alcohol use disorder   During the hospitalization, other adjustments were made to the patient's psychiatric medication regimen:  -Zoloft incr to 100 mg every day -nlatrexone 50 mg every day started    Patient's care was discussed during the interdisciplinary team meeting every day during the hospitalization.   The patient denied having side effects to prescribed psychiatric medication.   Gradually, patient started adjusting to milieu. The patient was evaluated each day by a clinical provider to ascertain response to treatment. Improvement was noted by the patient's report of decreasing symptoms, improved sleep and appetite, affect, medication tolerance, behavior, and participation in unit programming.  Patient was asked each day to complete a self inventory noting mood, mental status, pain, new symptoms, anxiety and concerns.     Symptoms were reported as significantly decreased or resolved completely by discharge.    On day of discharge,  the patient reports that their mood is stable. The patient denied having suicidal thoughts for more than 48 hours prior to discharge.  Patient denies having homicidal thoughts.  Patient denies having auditory hallucinations.  Patient denies any visual hallucinations or other symptoms of psychosis. The patient was motivated to continue taking medication with a goal of continued improvement in mental health.    The patient reports their target psychiatric symptoms of depression and suicidal thoughts, all responded well to the psychiatric medications, and the patient reports overall benefit other psychiatric hospitalization. Supportive psychotherapy was provided to the patient. The patient also participated in regular group therapy while hospitalized. Coping skills, problem solving as well as relaxation therapies were also part of the unit programming.   Labs were reviewed with the patient, and abnormal results were discussed with the patient. Electrolyte abnormalities improved.    The patient is able to verbalize their individual safety plan to this provider.   # It is recommended to the patient to continue psychiatric medications as prescribed, after discharge from the hospital.     # It is recommended to the patient to follow up with your outpatient psychiatric provider and PCP.   # It was discussed with the patient, the impact of alcohol, drugs, tobacco have been there overall psychiatric and medical wellbeing, and total abstinence from substance use was recommended the patient.ed.   # Prescriptions provided or sent directly to preferred pharmacy at discharge. Patient agreeable to plan. Given opportunity to ask questions. Appears to feel comfortable with discharge.    # In the event of worsening symptoms, the patient is instructed to call the crisis hotline, 911 and or go to the nearest ED for appropriate evaluation and treatment of symptoms.  To follow-up with primary care provider for other medical  issues, concerns and or health care needs   # Patient was discharged to care of his mother, with a plan to follow up as noted below.    Psychiatric Specialty Exam  Presentation  General Appearance:  Appropriate for Environment; Casual; Fairly Groomed  Eye Contact: Good  Speech: Normal Rate; Clear and Coherent  Speech Volume: Normal  Handedness: -- (not assessed)   Mood and Affect  Mood: Euthymic  Duration of Depression Symptoms: Greater than two weeks  Affect: Appropriate; Congruent; Full Range   Thought Process  Thought Processes: Linear  Descriptions of Associations:Intact  Orientation:Full (Time, Place and Person)  Thought Content:Logical  History of Schizophrenia/Schizoaffective disorder:No  Duration of Psychotic Symptoms:No data recorded Hallucinations:Hallucinations: None  Ideas of Reference:None  Suicidal Thoughts:Suicidal Thoughts: No  Homicidal Thoughts:Homicidal Thoughts: No   Sensorium  Memory: Immediate Good; Recent Good; Remote Good  Judgment: Good  Insight: Good   Executive Functions  Concentration: Good  Attention Span: Good  Recall: Good  Fund of Knowledge: Good  Language: Good   Psychomotor Activity  Psychomotor Activity: Psychomotor Activity: Normal   Assets  Assets: Communication Skills; Desire for Improvement; Resilience; Physical Health   Sleep  Sleep: Sleep: Fair Number of Hours of Sleep: 7.75   Physical Exam: Physical Exam ROS Blood pressure 120/87, pulse 66, temperature 97.6 F (36.4 C), temperature source Oral, resp. rate 18, height 6' (1.829 m), weight 68.9 kg, SpO2 100%. Body mass index is 20.61 kg/m.  Mental Status Per Nursing Assessment::   On Admission:  Suicidal ideation indicated by patient, Suicide plan  Demographic factors:  Male Loss Factors:  Loss of significant relationship Historical Factors:  Prior suicide attempts Risk Reduction Factors:  Positive social support,  Living with another person, especially a relative  Continued Clinical Symptoms:  Mood is stable. Denying SI.   Cognitive Features That Contribute To Risk:  None    Suicide Risk:  Mild:  There are no identifiable suicide plans, no associated intent, mild dysphoria and related symptoms, good self-control (both objective and subjective assessment), few other risk factors, and identifiable protective factors, including available and accessible social support.    Follow-up Information     [facility] Follow up on 08/21/2023.   Specialty: Behavioral Health Why: You have an appointment for medication management services on Saturday, 08/21/23 at 8:00 am.  It will be virtual MyChart Visit with Dr. Gilmore Laroche.  You also have an appointment for therapy services on 08/26/23 at 1:00 pm with Coolidge Breeze. Corporate treasurer visit) Contact information: [address] [place] [address] [phone]        [facility] Follow up.   Specialty: Behavioral Health Why: You are on a wait list for therapy services with this provider. Contact information: [address] [place] [address] [phone]                Plan Of Care/Follow-up recommendations:   -Follow-up with your outpatient psychiatric provider -instructions on appointment date, time, and address (location) are provided to you in discharge paperwork.   -Take your psychiatric medications as prescribed at discharge - instructions are provided to you in the discharge paperwork   -Follow-up with outpatient primary care doctor and other specialists -for management of preventative medicine and chronic medical disease   -If you are prescribed an atypical antipsychotic medication, we recommend that  your outpatient psychiatrist follow routine screening for side effects within 3 months of  discharge, including monitoring: AIMS scale, height, weight, blood pressure, fasting lipid panel, HbA1c, and fasting blood sugar.    -Recommend total abstinence from alcohol, tobacco, and other illicit drug use at discharge.    -If your psychiatric symptoms recur, worsen, or if you have side effects to your psychiatric medications, call your outpatient psychiatric provider, 911, [phone] or go to the nearest emergency department.   -If suicidal thoughts occur, immediately call your outpatient psychiatric provider, 911, [phone] or go to the nearest emergency department.    Cristy Hilts, MD 07/23/2023, 9:57 AM

## 2023-07-23 NOTE — Discharge Instructions (Signed)

## 2023-07-23 NOTE — Plan of Care (Signed)
  Problem: Education: Goal: Knowledge of [facility] Education information/materials will improve Outcome: Progressing Goal: Emotional status will improve Outcome: Progressing Goal: Mental status will improve Outcome: Progressing Goal: Verbalization of understanding the information provided will improve Outcome: Progressing   Problem: Education: Goal: Emotional status will improve Outcome: Progressing   Problem: Activity: Goal: Interest or engagement in activities will improve Outcome: Progressing Goal: Sleeping patterns will improve Outcome: Progressing   Problem: Coping: Goal: Ability to verbalize frustrations and anger appropriately will improve Outcome: Progressing Goal: Ability to demonstrate self-control will improve Outcome: Progressing   Problem: Health Behavior/Discharge Planning: Goal: Identification of resources available to assist in meeting health care needs will improve Outcome: Progressing Goal: Compliance with treatment plan for underlying cause of condition will improve Outcome: Progressing   Problem: Safety: Goal: Periods of time without injury will increase Outcome: Progressing

## 2023-07-23 NOTE — Group Note (Signed)
Date:  07/23/2023 Time:  11:56 AM  Group Topic/Focus:  Goals Group:   The focus of this group is to help patients establish daily goals to achieve during treatment and discuss how the patient can incorporate goal setting into their daily lives to aide in recovery.    Participation Level:  Active  Participation Quality:  Appropriate  Affect:  Appropriate  Cognitive:  Appropriate  Insight: Appropriate  Engagement in Group:  Engaged  Modes of Intervention:  Activity  Additional Comments:    Christopher Hardin 07/23/2023, 11:56 AM

## 2023-07-23 NOTE — Progress Notes (Signed)
   07/23/23 0545  15 Minute Checks  Location Bedroom  Visual Appearance Calm  Behavior Sleeping  Sleep (Behavioral Health Patients Only)  Calculate sleep? (Click Yes once per 24 hr at 0600 safety check) Yes  Documented sleep last 24 hours 8.5
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
## 2023-07-23 NOTE — Progress Notes (Signed)
Patient discharged to home accompanied by friend. Discharge instructions, all required discharge documents, all personal belongings and information about follow-up appointment given to pt with verbalization of understanding. Patient denied SI/HI and AVH at time of discharge. Plan of Care resolved. Patient escorted to lobby by RN at 1346.  07/23/23 0810  Psych Admission Type (Psych Patients Only)  Admission Status Involuntary  Psychosocial Assessment  Patient Complaints None  Eye Contact Fair  Facial Expression Animated  Affect Appropriate to circumstance  Speech Logical/coherent  Interaction Assertive  Motor Activity Other (Comment) (WNL)  Appearance/Hygiene Unremarkable  Behavior Characteristics Cooperative;Calm  Mood Pleasant  Thought Process  Coherency WDL  Content WDL  Delusions None reported or observed  Perception WDL  Hallucination None reported or observed  Judgment Impaired  Confusion None  Danger to Self  Current suicidal ideation? Denies  Agreement Not to Harm Self Yes  Description of Agreement Verbal  Danger to Others  Danger to Others None reported or observed

## 2023-07-23 NOTE — Plan of Care (Signed)
  Problem: Education: Goal: Knowledge of [facility] Education information/materials will improve Outcome: Progressing   Problem: Education: Goal: Emotional status will improve Outcome: Progressing   Problem: Education: Goal: Mental status will improve Outcome: Progressing   Problem: Coping: Goal: Ability to verbalize frustrations and anger appropriately will improve Outcome: Progressing   Problem: Safety: Goal: Periods of time without injury will increase Outcome: Progressing

## 2023-07-23 NOTE — Group Note (Signed)
Recreation Therapy Group Note   Group Topic:Problem Solving  Group Date: 07/23/2023 Start Time: 1012 End Time: 1036 Facilitators: Sadaf Przybysz-McCall, LRT,CTRS Location: [address] Dayroom   Group Topic: Communication, Team Building, Problem Solving  Goal Area(s) Addresses:  Patient will effectively work with peer towards shared goal.  Patient will identify skills used to make activity successful.  Patient will share challenges and verbalize solution-driven approaches used. Patient will identify how skills used during activity can be used to reach post d/c goals.   Intervention: STEM Activity   Group Description: [employer]. Patients were provided the following materials: 5 drinking straws, 5 rubber bands, 5 paper clips, 2 index cards, 2 drinking cups, and 2 toilet paper rolls. Using the provided materials patients were asked to build a launching mechanism to launch a ping pong ball across the room, approximately 10 feet. Patients were divided into teams of 3-5. Instructions required all materials be incorporated into the device, functionality of items left to the peer group's discretion.  Education: Pharmacist, community, Scientist, physiological, Air cabin crew, Building control surveyor.   Education Outcome: Acknowledges education/In group clarification offered/Needs additional education.   Affect/Mood: Appropriate   Participation Level: Engaged   Participation Quality: Independent   Behavior: Appropriate   Speech/Thought Process: Focused   Insight: Good   Judgement: Good   Modes of Intervention: STEM Activity   Patient Response to Interventions:  Engaged   Education Outcome:  In group clarification offered    Clinical Observations/Individualized Feedback: Pt was attentive and focused on finding ways to complete the activity. Pt was engaged and worked well with peers in being successful.     Plan: Continue to engage patient in RT group sessions 2-3x/week.   Paulmichael Schreck-McCall, LRT,CTRS  07/23/2023 12:47 PM

## 2023-07-23 NOTE — Progress Notes (Signed)
  Patient alert and oriented x4. Patient cooperative with care. Patient attended group this evening, observed patient socializing appropriately with peers and staff. Patient denies SI,HI, & AVH. Patient rated his anxiety and depression zero. Patient compliant with medications per MD orders. He stated his mood "excited." No s/s of distress noted. Routine safety checks maintained.   07/22/23 2200  Psych Admission Type (Psych Patients Only)  Admission Status Involuntary  Psychosocial Assessment  Patient Complaints Substance abuse  Eye Contact Fair  Facial Expression Animated  Affect Appropriate to circumstance  Speech Logical/coherent  Interaction Assertive  Motor Activity Other (Comment)  Appearance/Hygiene Unremarkable  Behavior Characteristics Cooperative  Mood Pleasant  Thought Process  Coherency WDL  Content WDL  Delusions None reported or observed  Perception WDL  Hallucination None reported or observed  Judgment Impaired  Confusion None  Danger to Self  Current suicidal ideation? Denies  Agreement Not to Harm Self Yes  Description of Agreement verbal  Danger to Others  Danger to Others None reported or observed

## 2023-07-23 NOTE — Group Note (Signed)
Date:  07/23/2023 Time:  12:04 PM  Group Topic/Focus:  Developing a Wellness Toolbox:   The focus of this group is to help patients develop a "wellness toolbox" with skills and strategies to promote recovery upon discharge.    Participation Level:  Active  Participation Quality:  Appropriate  Affect:  Appropriate  Cognitive:  Appropriate  Insight: Appropriate  Engagement in Group:  Engaged  Modes of Intervention:  Activity  Additional Comments:    Sae Handrich 07/23/2023, 12:04 PM

## 2023-07-23 NOTE — Progress Notes (Signed)
  [facility] Adult Case Management Discharge Plan :  Will you be returning to the same living situation after discharge:  Yes,  Pt will be returning back to his home At discharge, do you have transportation home?: Yes,  pt ride will pick him up at 1pm  Do you have the ability to pay for your medications: Yes,  [facility]   Release of information consent forms completed and in the chart;  Patient's signature needed at discharge.  Patient to Follow up at:  Follow-up Information     [facility] Follow up on 08/21/2023.   Specialty: Behavioral Health Why: You have an appointment for medication management services on Saturday, 08/21/23 at 8:00 am.  It will be virtual MyChart Visit with Dr. Gilmore Laroche.  You also have an appointment for therapy services on 08/26/23 at 1:00 pm with Coolidge Breeze. Corporate treasurer visit) Contact information: [address] [place] [address] [phone]        [facility] Follow up.   Specialty: Behavioral Health Why: You are on a wait list for therapy services with this provider. Contact information: [address] [place] [address] [phone]                Next level of care provider has access to [facility] Link:yes  Safety Planning and Suicide Prevention discussed: Yes,  Kathline Magic, mother.  [phone]     Has patient been referred to the Quitline?: Patient refused referral for treatment  Patient has been referred for addiction treatment: Patient refused referral for treatment.  Isabella Bowens, LCSWA 07/23/2023, 10:19 AM

## 2023-07-26 NOTE — Group Note (Signed)
Date:  07/26/2023 Time:  9:24 PM  Group Topic/Focus:  Developing a Wellness Toolbox:   The focus of this group is to help patients develop a "wellness toolbox" with skills and strategies to promote recovery upon discharge.    Participation Level:  Did Not Attend  Participation Quality:    Affect:    Cognitive:    Insight:   Engagement in Group:    Modes of Intervention:    Additional Comments:    Malachai Schalk 07/26/2023, 9:24 PM

## 2023-08-21 ENCOUNTER — Encounter (HOSPITAL_COMMUNITY): Payer: Self-pay

## 2023-08-21 ENCOUNTER — Ambulatory Visit (HOSPITAL_COMMUNITY): Payer: 59 | Admitting: Psychiatry

## 2023-08-26 ENCOUNTER — Ambulatory Visit (HOSPITAL_COMMUNITY): Payer: 59 | Admitting: Licensed Clinical Social Worker

## 2023-08-26 DIAGNOSIS — F411 Generalized anxiety disorder: Secondary | ICD-10-CM

## 2023-08-26 DIAGNOSIS — F3341 Major depressive disorder, recurrent, in partial remission: Secondary | ICD-10-CM

## 2023-08-26 DIAGNOSIS — F1090 Alcohol use, unspecified, uncomplicated: Secondary | ICD-10-CM | POA: Diagnosis not present
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
## 2023-09-22 ENCOUNTER — Other Ambulatory Visit: Payer: Self-pay

## 2023-09-22 ENCOUNTER — Emergency Department
Admission: EM | Admit: 2023-09-22 | Discharge: 2023-09-23 | Disposition: A | Discharge status: Home / Self Care | Payer: 59 | Attending: Emergency Medicine | Admitting: Emergency Medicine

## 2023-09-22 DIAGNOSIS — F1090 Alcohol use, unspecified, uncomplicated: Secondary | ICD-10-CM

## 2023-09-22 DIAGNOSIS — F1099 Alcohol use, unspecified with unspecified alcohol-induced disorder: Secondary | ICD-10-CM | POA: Insufficient documentation

## 2023-09-22 DIAGNOSIS — Z79899 Other long term (current) drug therapy: Secondary | ICD-10-CM | POA: Insufficient documentation

## 2023-09-22 DIAGNOSIS — R45851 Suicidal ideations: Secondary | ICD-10-CM

## 2023-09-22 DIAGNOSIS — F329 Major depressive disorder, single episode, unspecified: Secondary | ICD-10-CM | POA: Insufficient documentation

## 2023-09-22 DIAGNOSIS — F331 Major depressive disorder, recurrent, moderate: Secondary | ICD-10-CM

## 2023-09-22 LAB — CBC
HCT: 38.3 % — ABNORMAL LOW (ref 39.0–52.0)
Hemoglobin: 13 g/dL (ref 13.0–17.0)
MCH: 27.6 pg (ref 26.0–34.0)
MCHC: 33.9 g/dL (ref 30.0–36.0)
MCV: 81.3 fL (ref 80.0–100.0)
Platelets: 250 10*3/uL (ref 150–400)
RBC: 4.71 MIL/uL (ref 4.22–5.81)
RDW: 14.6 % (ref 11.5–15.5)
WBC: 3.4 10*3/uL — ABNORMAL LOW (ref 4.0–10.5)
nRBC: 0 % (ref 0.0–0.2)

## 2023-09-22 LAB — COMPREHENSIVE METABOLIC PANEL
ALT: 47 U/L — ABNORMAL HIGH (ref 0–44)
AST: 82 U/L — ABNORMAL HIGH (ref 15–41)
Albumin: 4.6 g/dL (ref 3.5–5.0)
Alkaline Phosphatase: 46 U/L (ref 38–126)
Anion gap: 16 — ABNORMAL HIGH (ref 5–15)
BUN: 9 mg/dL (ref 6–20)
CO2: 25 mmol/L (ref 22–32)
Calcium: 9.3 mg/dL (ref 8.9–10.3)
Chloride: 101 mmol/L (ref 98–111)
Creatinine, Ser: 0.7 mg/dL (ref 0.61–1.24)
GFR, Estimated: >60 mL/min (ref >60)
Glucose, Bld: 96 mg/dL (ref 70–99)
Potassium: 3.3 mmol/L — ABNORMAL LOW (ref 3.5–5.1)
Sodium: 142 mmol/L (ref 135–145)
Total Bilirubin: 0.7 mg/dL (ref <1.2)
Total Protein: 8.3 g/dL — ABNORMAL HIGH (ref 6.5–8.1)

## 2023-09-22 LAB — URINE DRUG SCREEN, QUALITATIVE (ARMC ONLY)
Amphetamines, Ur Screen: NOT DETECTED
Barbiturates, Ur Screen: NOT DETECTED
Benzodiazepine, Ur Scrn: NOT DETECTED
Cannabinoid 50 Ng, Ur ~~LOC~~: NOT DETECTED
Cocaine Metabolite,Ur ~~LOC~~: NOT DETECTED
MDMA (Ecstasy)Ur Screen: NOT DETECTED
Methadone Scn, Ur: NOT DETECTED
Opiate, Ur Screen: NOT DETECTED
Phencyclidine (PCP) Ur S: NOT DETECTED
Tricyclic, Ur Screen: NOT DETECTED

## 2023-09-22 LAB — ETHANOL: Alcohol, Ethyl (B): 294 mg/dL — ABNORMAL HIGH (ref <10)

## 2023-09-22 MED ORDER — LORAZEPAM 2 MG PO TABS: 0.0000 mg | ORAL_TABLET | Freq: Two times a day (BID) | ORAL | Status: DC

## 2023-09-22 MED ORDER — LORAZEPAM 2 MG/ML IJ SOLN: 0.0000 mg | Freq: Two times a day (BID) | INTRAMUSCULAR | Status: DC

## 2023-09-22 MED ORDER — THIAMINE MONONITRATE 100 MG PO TABS
100.0000 mg | ORAL_TABLET | Freq: Every day | ORAL | Status: DC
  Administered 2023-09-23: 100 mg via ORAL
  Filled 2023-09-22: qty 1

## 2023-09-22 MED ORDER — AMLODIPINE BESYLATE 5 MG PO TABS
5.0000 mg | ORAL_TABLET | Freq: Every day | ORAL | Status: DC
Start: 2023-09-22 — End: 2023-09-23
  Administered 2023-09-22 – 2023-09-23 (×2): 5 mg via ORAL
  Filled 2023-09-22 (×2): qty 1

## 2023-09-22 MED ORDER — TRAZODONE HCL 50 MG PO TABS
50.0000 mg | ORAL_TABLET | Freq: Every evening | ORAL | Status: DC | PRN
Start: 2023-09-22 — End: 2023-09-23

## 2023-09-22 MED ORDER — THIAMINE HCL 100 MG/ML IJ SOLN
100.0000 mg | Freq: Every day | INTRAMUSCULAR | Status: DC
  Filled 2023-09-22: qty 1

## 2023-09-22 MED ORDER — LORAZEPAM 2 MG/ML IJ SOLN: 0.0000 mg | Freq: Four times a day (QID) | INTRAMUSCULAR | Status: DC

## 2023-09-22 MED ORDER — LORAZEPAM 2 MG PO TABS: 0.0000 mg | ORAL_TABLET | Freq: Four times a day (QID) | ORAL | Status: DC

## 2023-09-22 MED ORDER — SERTRALINE HCL 100 MG PO TABS
100.0000 mg | ORAL_TABLET | Freq: Every day | ORAL | Status: DC
Start: 2023-09-22 — End: 2023-09-23
  Administered 2023-09-22 – 2023-09-23 (×2): 100 mg via ORAL
  Filled 2023-09-22: qty 2
  Filled 2023-09-22: qty 1

## 2023-09-22 NOTE — ED Notes (Signed)
IVC/ consult pending/ moved to [facility] 4

## 2023-09-22 NOTE — ED Notes (Signed)
Patient provided snack at appropriate snack time.  Pt consumed 100% of snack provided, tolerated well w/o complaints

## 2023-09-22 NOTE — ED Provider Notes (Signed)
[facility] Provider Note    Event Date/Time   First MD Initiated Contact with Patient 09/22/23 1751     (approximate)   History   Suicidal   HPI  Christopher Hardin is a 42 y.o. male with history of polysubstance use, major depression, here with suicidal ideation and reported attempt.  Patient states that he got into an argument with his significant other and person he recently bought a car from.  He states he had been drinking a little bit.  He got angry.  Reportedly had several knives and was threatening to harm himself, as well as commit suicide via police.  Police were called and he arrives under IVC.  He now states he did not mean any of this and would like to go home.  He was previously hospitalized for suicide attempt recently.     Physical Exam   Triage Vital Signs: ED Triage Vitals  Encounter Vitals Group     BP --      Systolic BP Percentile --      Diastolic BP Percentile --      Pulse Rate 09/22/23 1535 96     Resp 09/22/23 1535 18     Temp 09/22/23 1535 98.3 F (36.8 C)     Temp Source 09/22/23 1535 Oral     SpO2 09/22/23 1535 99 %     Weight --      Height --      Head Circumference --      Peak Flow --      Pain Score 09/22/23 1536 0     Pain Loc --      Pain Education --      Exclude from Growth Chart --     Most recent vital signs: Vitals:   09/22/23 1535  Pulse: 96  Resp: 18  Temp: 98.3 F (36.8 C)  SpO2: 99%     General: Awake, no distress.  CV:  Good peripheral perfusion.  Resp:  Normal work of breathing.  Abd:  No distention.  Other:  Superficial abrasion to the left wrist, no deep lacerations.  Endorses depression but no suicidal or homicidal action on my assessment   ED Results / Procedures / Treatments   Labs (all labs ordered are listed, but only abnormal results are displayed) Labs Reviewed  COMPREHENSIVE METABOLIC PANEL - Abnormal; Notable for the following components:      Result Value    Potassium 3.3 (*)    Total Protein 8.3 (*)    AST 82 (*)    ALT 47 (*)    Anion gap 16 (*)    All other components within normal limits  CBC - Abnormal; Notable for the following components:   WBC 3.4 (*)    HCT 38.3 (*)    All other components within normal limits  ETHANOL  SALICYLATE LEVEL  ACETAMINOPHEN LEVEL  URINE DRUG SCREEN, QUALITATIVE ([facility] ONLY)     EKG    RADIOLOGY    I also independently reviewed and agree with radiologist interpretations.   PROCEDURES:  Critical Care performed: No  MEDICATIONS ORDERED IN ED: Medications - No data to display   IMPRESSION / MDM / ASSESSMENT AND PLAN / ED COURSE  I reviewed the triage vital signs and the nursing notes.                              Differential diagnosis includes,  but is not limited to, suicidal ideation, substance use, adjustment disorder,  Patient's presentation is most consistent with acute presentation with potential threat to life or bodily function.  42 year old male with extensive previous psychiatric history here with suicidal ideation.  Unclear whether this is acute on chronic depression versus situational issue with possible component of substance use.  Patient is medically stable.  Vital signs are unremarkable.  Mild transaminitis is likely due to his alcohol use and he has no abdominal pain.  This is somewhat chronic.  Medically stable for psychiatric disposition.  FINAL CLINICAL IMPRESSION(S) / ED DIAGNOSES   Final diagnoses:  Suicidal ideation     Rx / DC Orders   ED Discharge Orders     None        Note:  This document was prepared using Dragon voice recognition software and may include unintentional dictation errors.   Shaune Pollack, MD 09/22/23 [phone]

## 2023-09-22 NOTE — ED Notes (Signed)
Received report from [facility] Patient to be transferred to [facility] room 4.
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
## 2023-09-22 NOTE — ED Notes (Signed)
Legal documents scanned and efiled

## 2023-09-22 NOTE — ED Notes (Signed)
Pt dressed in paper scrubs by this tech and two bpd officers. Pt items placed in hospital bag are the following: White and black sandals Black plaid pants Tan shirt Blue plaid underwear One black bracelet One yellow ring Black wallet One vape Black socks

## 2023-09-22 NOTE — ED Notes (Signed)
Pt. To BHU from ED ambulatory without difficulty, to room  BHU 4. Report from [facility]. Pt. Is alert and oriented, warm and dry in no distress. Pt. Denies SI, HI, and AVH. Patient states he really needs to get home cause he is scared his girlfriend might hurt herself. Pt. Calm and cooperative. Pt. Made aware of security cameras and Q15 minute rounds. Pt. Encouraged to let Nursing staff know of any concerns or needs.   ENVIRONMENTAL ASSESSMENT Potentially harmful objects out of patient reach: Yes.   Personal belongings secured: Yes.   Patient dressed in hospital provided attire only: Yes.   Plastic bags out of patient reach: Yes.   Patient care equipment (cords, cables, call bells, lines, and drains) shortened, removed, or accounted for: Yes.   Equipment and supplies removed from bottom of stretcher: Yes.   Potentially toxic materials out of patient reach: Yes.   Sharps container removed or out of patient reach: Yes.

## 2023-09-22 NOTE — ED Notes (Signed)
Acetaminophen level (09/22/23 1540);Salicylate level (09/22/23 1540) hemolyzed per lab. EDP C. Isaacs notified, no orders for recollect.

## 2023-09-22 NOTE — ED Triage Notes (Signed)
Pt. Arrived to ED in police custody for SI. PD states pt. They were called to pt's house for threats of self-harm, found pt. In driveway with two large kitchen knives, and he had small lac to left forearm. PD states pt. Had verbalized SI or suicide by cop ideation to his fiance. Pt. Sites stress of life and vehicle as catalyst for todays events. Pt. States he has been out of zoloft x1 week and out of his BP meds x1 month. Last hospitalization for SI was 2 months prior. Pt. Is alert and oriented and conversational in triage, denies SI/HI/self harm currently.

## 2023-09-22 NOTE — ED Notes (Addendum)
Moved to [employer] 4 via wheelchair by EDT and Security.1 bag of belongings sent with staff to be locked in [place] locker room. Pt alert and oriented X4, cooperative, RR even and unlabored, color WNL. Pt in NAD. Able to eat and drink independently, ambulates safely, continent.

## 2023-09-23 ENCOUNTER — Other Ambulatory Visit: Payer: Self-pay

## 2023-09-23 ENCOUNTER — Encounter (HOSPITAL_COMMUNITY): Payer: Self-pay | Admitting: Psychiatry

## 2023-09-23 ENCOUNTER — Inpatient Hospital Stay (HOSPITAL_COMMUNITY)
Admission: AD | Admit: 2023-09-23 | Discharge: 2023-09-27 | DRG: 885 | Disposition: A | Discharge status: Home / Self Care | Payer: 59 | Attending: Psychiatry | Admitting: Psychiatry

## 2023-09-23 DIAGNOSIS — F1010 Alcohol abuse, uncomplicated: Secondary | ICD-10-CM | POA: Diagnosis present

## 2023-09-23 DIAGNOSIS — R748 Abnormal levels of other serum enzymes: Secondary | ICD-10-CM | POA: Diagnosis present

## 2023-09-23 DIAGNOSIS — Z79899 Other long term (current) drug therapy: Secondary | ICD-10-CM

## 2023-09-23 DIAGNOSIS — I10 Essential (primary) hypertension: Secondary | ICD-10-CM | POA: Diagnosis present

## 2023-09-23 DIAGNOSIS — Z9151 Personal history of suicidal behavior: Secondary | ICD-10-CM

## 2023-09-23 DIAGNOSIS — F331 Major depressive disorder, recurrent, moderate: Secondary | ICD-10-CM | POA: Diagnosis not present

## 2023-09-23 DIAGNOSIS — R45851 Suicidal ideations: Secondary | ICD-10-CM | POA: Diagnosis not present

## 2023-09-23 DIAGNOSIS — F332 Major depressive disorder, recurrent severe without psychotic features: Principal | ICD-10-CM | POA: Diagnosis present

## 2023-09-23 DIAGNOSIS — Z811 Family history of alcohol abuse and dependence: Secondary | ICD-10-CM

## 2023-09-23 DIAGNOSIS — Z5982 Transportation insecurity: Secondary | ICD-10-CM

## 2023-09-23 DIAGNOSIS — F1090 Alcohol use, unspecified, uncomplicated: Secondary | ICD-10-CM | POA: Diagnosis present

## 2023-09-23 DIAGNOSIS — E876 Hypokalemia: Secondary | ICD-10-CM | POA: Diagnosis present

## 2023-09-23 DIAGNOSIS — F329 Major depressive disorder, single episode, unspecified: Principal | ICD-10-CM | POA: Insufficient documentation

## 2023-09-23 DIAGNOSIS — F17290 Nicotine dependence, other tobacco product, uncomplicated: Secondary | ICD-10-CM | POA: Diagnosis present

## 2023-09-23 LAB — ACETAMINOPHEN LEVEL: Acetaminophen (Tylenol), Serum: <10 ug/mL — ABNORMAL LOW (ref 10–30)

## 2023-09-23 LAB — SALICYLATE LEVEL: Salicylate Lvl: <7 mg/dL — ABNORMAL LOW (ref 7.0–30.0)

## 2023-09-23 MED ORDER — SERTRALINE HCL 100 MG PO TABS
100.0000 mg | ORAL_TABLET | Freq: Every day | ORAL | Status: DC
Start: 2023-09-23 — End: 2023-09-27
  Administered 2023-09-24 – 2023-09-27 (×4): 100 mg via ORAL
  Filled 2023-09-23 (×6): qty 1

## 2023-09-23 MED ORDER — DIPHENHYDRAMINE HCL 25 MG PO CAPS: 50.0000 mg | ORAL_CAPSULE | Freq: Three times a day (TID) | ORAL | Status: DC | PRN

## 2023-09-23 MED ORDER — AMLODIPINE BESYLATE 5 MG PO TABS
5.0000 mg | ORAL_TABLET | Freq: Every day | ORAL | Status: DC
Start: 2023-09-23 — End: 2023-09-27
  Administered 2023-09-24 – 2023-09-27 (×4): 5 mg via ORAL
  Filled 2023-09-23 (×6): qty 1

## 2023-09-23 MED ORDER — HALOPERIDOL LACTATE 5 MG/ML IJ SOLN: 5.0000 mg | Freq: Three times a day (TID) | INTRAMUSCULAR | Status: DC | PRN

## 2023-09-23 MED ORDER — HALOPERIDOL LACTATE 5 MG/ML IJ SOLN: 10.0000 mg | Freq: Three times a day (TID) | INTRAMUSCULAR | Status: DC | PRN

## 2023-09-23 MED ORDER — LORAZEPAM 2 MG/ML IJ SOLN: 2.0000 mg | Freq: Three times a day (TID) | INTRAMUSCULAR | Status: DC | PRN

## 2023-09-23 MED ORDER — MELATONIN 3 MG PO TABS
3.0000 mg | ORAL_TABLET | Freq: Every evening | ORAL | Status: DC | PRN
Start: 2023-09-23 — End: 2023-09-27
  Administered 2023-09-23 – 2023-09-26 (×3): 3 mg via ORAL
  Filled 2023-09-23 (×3): qty 1

## 2023-09-23 MED ORDER — CHLORTHALIDONE 25 MG PO TABS
25.0000 mg | ORAL_TABLET | Freq: Every day | ORAL | Status: DC
  Administered 2023-09-23 – 2023-09-27 (×5): 25 mg via ORAL
  Filled 2023-09-23 (×7): qty 1

## 2023-09-23 MED ORDER — HYDROXYZINE HCL 25 MG PO TABS
25.0000 mg | ORAL_TABLET | Freq: Three times a day (TID) | ORAL | Status: DC | PRN
  Administered 2023-09-26: 25 mg via ORAL
  Filled 2023-09-23 (×3): qty 1

## 2023-09-23 MED ORDER — MAGNESIUM HYDROXIDE 400 MG/5ML PO SUSP: 30.0000 mL | Freq: Every day | ORAL | Status: DC | PRN

## 2023-09-23 MED ORDER — ALUM & MAG HYDROXIDE-SIMETH 200-200-20 MG/5ML PO SUSP: 30.0000 mL | ORAL | Status: DC | PRN

## 2023-09-23 MED ORDER — ACETAMINOPHEN 325 MG PO TABS: 650.0000 mg | ORAL_TABLET | Freq: Four times a day (QID) | ORAL | Status: DC | PRN

## 2023-09-23 MED ORDER — DIPHENHYDRAMINE HCL 50 MG/ML IJ SOLN: 50.0000 mg | Freq: Three times a day (TID) | INTRAMUSCULAR | Status: DC | PRN

## 2023-09-23 MED ORDER — HALOPERIDOL 5 MG PO TABS: 5.0000 mg | ORAL_TABLET | Freq: Three times a day (TID) | ORAL | Status: DC | PRN

## 2023-09-23 NOTE — Tx Team (Signed)
Initial Treatment Plan 09/23/2023 7:03 PM Christopher Hardin XBJ:[id number]    PATIENT STRESSORS: Financial difficulties   Marital or family conflict   Medication change or noncompliance     PATIENT STRENGTHS: Ability for insight  Average or above average intelligence  Communication skills  General fund of knowledge  Motivation for treatment/growth  Supportive family/friends    PATIENT IDENTIFIED PROBLEMS: Suicide Risk  Coping skills for anxiety/frustration  Medication compliance.                 DISCHARGE CRITERIA:  Improved stabilization in mood, thinking, and/or behavior Need for constant or close observation no longer present Reduction of life-threatening or endangering symptoms to within safe limits  PRELIMINARY DISCHARGE PLAN: Return to previous living arrangement  PATIENT/FAMILY INVOLVEMENT: This treatment plan has been presented to and reviewed with the patient, Christopher Hardin, and/or family member.  The patient and family have been given the opportunity to ask questions and make suggestions.  Karren Burly, RN 09/23/2023, 7:03 PM

## 2023-09-23 NOTE — ED Notes (Signed)
Dinner tray provided

## 2023-09-23 NOTE — BH Assessment (Signed)
Per [facility] AC Debbe Bales M.), patient pending bed within [facility], with [facility].

## 2023-09-23 NOTE — Progress Notes (Addendum)
Pt is a 42 year old male received from [facility] ED involuntarily.  Pt admitted for threatening to cut his wrist with a knife. Pt states that he recently purchased a car and  became frustrated after car broke down. "It broke down, I had to walk home and became furious at the situation. "I should have used my coping skills, but didn't. I wasn't going to hurt myself. My wife called her sister on the phone because she was scared. The police came to my home and I did what they said." Pt was hospitalized at [facility] 2 months ago. States that he has not been taking his Zoloft and BP medication for slightly over a week. "I feel so bad, my fiance is alone on Thanksgiving because of me." Denies verbal/emotional/physical or sexual abuse history. Denies AVH and is currently able to contract for safety. 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  Pt states that he has decreased his alcohol intake. "I drink 1 beer daily on weekdays, more on weekends when I add some shots of vodka."   Admission assessment and skin assessment complete, 15 minutes checks initiated,  Belongings listed and secured.  Treatment plan explained and pt. settled into the unit.

## 2023-09-23 NOTE — Consult Note (Signed)
[facility] Consult Note  Patient Name: Christopher Hardin MRN: [id number] DOB: 1981/10/20 DATE OF Consult: 09/23/2023  PRIMARY PSYCHIATRIC DIAGNOSES  1.  Suicidal Ideation and Gestures 2.  Alcohol Use Disorder 3.  MDD  RECOMMENDATIONS  Admit to inpatient psych for safety and stabilization.  Medication recommendations: Continue Sertraline 100mg  PO daily for depression and anxiety. Continue Trazodone 50mg  PO QHS PRN for sleep. Continue CIWA protocol  Non-Medication/therapeutic recommendations: Refer to outpatient psychiatric provider for medication management, therapy, and substance abuse treatment upon discharge from inpatient psych admission.   Communication: Treatment team members (and family members if applicable) who were involved in treatment/care discussions and planning, and with whom we spoke or engaged with via secure text/chat, include the following:  treatment team   Thank you for involving [place] in the care of this patient. If you have any additional questions or concerns, please call [phone] and ask for me or the provider on-call.  TELEPSYCHIATRY ATTESTATION & CONSENT  As the provider for this telehealth consult, I attest that I verified the patient's identity using two separate identifiers, introduced myself to the patient, provided my credentials, disclosed my location, and performed this encounter via a HIPAA-compliant, real-time, face-to-face, two-way, interactive audio and video platform and with the full consent and agreement of the patient (or guardian as applicable.)  Patient physical location: [place]. Telehealth provider physical location: home office in state of [place].  Video start time: 0235 (Central Time) Video end time: 0247 (Central Time)  IDENTIFYING DATA  Christopher Hardin is a 42 y.o. year-old male for whom a psychiatric consultation has been ordered by the primary provider. The patient was identified using two separate identifiers.   CHIEF COMPLAINT/REASON FOR CONSULT  "I held a knife to my wrist"  HISTORY OF PRESENT ILLNESS (HPI)  The patient is a 42 year old male who was brought to the ER for evaluation following a recent episode where he threatened self-harm with a knife to his wrist. This act, attributed to frustration and stress, led to police involvement after scaring his fiance, though Christopher Hardin insists he had no intention of harming himself or others. Patient reports that he bought a car on Friday and the car broke down today which caused him to feel very frustrated.   Christopher Hardin domestic life includes living with his fiance and their combined six children, with his fiance bringing four children into their union and Christopher Hardin having two of his own. 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 His consumption habits include daily drinking, typically a 40-ounce beer, with a recent incident involving several shots of liquor prior to the self-harm episode. His BAL was 294 on arrival to the ER.  Regarding his mental health, Christopher Hardin admits to occasional feelings of depression and has been prescribed sertraline, although he has not taken this medication in the past week due to depletion of his supply. Additionally, he is on amlodipine for blood pressure management. He denies any family history of mental health issues, suicide attempts, or drug use, with the exception of alcohol.  Christopher Hardin has a history of a previous suicide attempt exactly two months prior on 07/23/23, involving an overdose on medication that resulted in a six-day hospital admission. During the recent incident, he expressed a desire for "suicide by cop" to the police.     PAST PSYCHIATRIC HISTORY  Christopher Hardin has a history of a previous suicide attempt exactly two months prior on 07/23/23, involving an overdose on medication that resulted in a six-day hospital admission.  PAST MEDICAL HISTORY  Past Medical History:  Diagnosis Date   Hypertension      HOME  MEDICATIONS  Facility Ordered Medications  Medication   amLODipine (NORVASC) tablet 5 mg   sertraline (ZOLOFT) tablet 100 mg   traZODone (DESYREL) tablet 50 mg   LORazepam (ATIVAN) injection 0-4 mg   Or   LORazepam (ATIVAN) tablet 0-4 mg   [START ON 09/25/2023] LORazepam (ATIVAN) injection 0-4 mg   Or   [START ON 09/25/2023] LORazepam (ATIVAN) tablet 0-4 mg   thiamine (VITAMIN B1) tablet 100 mg   Or   thiamine (VITAMIN B1) injection 100 mg   PTA Medications  Medication Sig   amLODipine (NORVASC) 5 MG tablet Take 1 tablet (5 mg total) by mouth daily for 14 days.   sertraline (ZOLOFT) 100 MG tablet Take 1 tablet (100 mg total) by mouth daily.   traZODone (DESYREL) 50 MG tablet Take 1 tablet (50 mg total) by mouth at bedtime as needed for sleep.   chlorthalidone (HYGROTON) 25 MG tablet Take 25 mg by mouth daily.     ALLERGIES  No Known Allergies  SOCIAL & SUBSTANCE USE HISTORY  Social History   Socioeconomic History   Marital status: Single    Spouse name: Not on file   Number of children: Not on file   Years of education: Not on file   Highest education level: Not on file  Occupational History   Not on file  Tobacco Use   Smoking status: Every Day    Current packs/day: 0.25    Types: Cigarettes   Smokeless tobacco: Never  Substance and Sexual Activity   Alcohol use: Yes    Alcohol/week: 1.0 standard drink of alcohol    Types: 1 Cans of beer per week   Drug use: No   Sexual activity: Not on file  Other Topics Concern   Not on file  Social History Narrative   Not on file   Social Determinants of Health   Financial Resource Strain: Not on file  Food Insecurity: Patient Declined (07/17/2023)   Hunger Vital Sign    Worried About Running Out of Food in the Last Year: Patient declined    Ran Out of Food in the Last Year: Patient declined  Transportation Needs: Patient Declined (07/17/2023)   PRAPARE - Administrator, Civil Service (Medical): Patient  declined    Lack of Transportation (Non-Medical): Patient declined  Physical Activity: Not on file  Stress: Not on file  Social Connections: Not on file   Social History   Tobacco Use  Smoking Status Every Day   Current packs/day: 0.25   Types: Cigarettes  Smokeless Tobacco Never   Social History   Substance and Sexual Activity  Alcohol Use Yes   Alcohol/week: 1.0 standard drink of alcohol   Types: 1 Cans of beer per week   Social History   Substance and Sexual Activity  Drug Use No    Additional pertinent information .  FAMILY HISTORY  Family History  Problem Relation Age of Onset   Bladder Cancer Neg Hx    Prostate cancer Neg Hx    Kidney cancer Neg Hx    Family Psychiatric History (if known):  alcohol abuse  MENTAL STATUS EXAM (MSE)  Presentation  General Appearance:  Appropriate for Environment  Eye Contact: Fair  Speech: Clear and Coherent  Speech Volume: Normal  Handedness: -- (not assessed)   Mood and Affect  Mood: Depressed  Affect: Congruent  Thought Process  Thought Processes: Coherent  Descriptions of Associations: Intact  Orientation: Full (Time, Place and Person)  Thought Content: Logical  History of Schizophrenia/Schizoaffective disorder: No  Duration of Psychotic Symptoms:No data recorded Hallucinations:Hallucinations: None  Ideas of Reference: None  Suicidal Thoughts:Suicidal Thoughts: No  Homicidal Thoughts:Homicidal Thoughts: No   Sensorium  Memory: Immediate Good; Recent Good; Remote Good  Judgment: Fair  Insight: Fair   Art therapist  Concentration: Good  Attention Span: Good  Recall: Good  Fund of Knowledge: Good  Language: Good   Psychomotor Activity  Psychomotor Activity:Psychomotor Activity: Normal  Assets  Assets: Communication Skills; Housing; Desire for Improvement   Sleep  Sleep:Sleep: Good   VITALS  Blood pressure (!) 151/90, pulse 64, temperature 98 F  (36.7 C), temperature source Oral, resp. rate 16, SpO2 100%.  LABS  Admission on 09/22/2023  Component Date Value Ref Range Status   Sodium 09/22/2023 142  135 - 145 mmol/L Final   Potassium 09/22/2023 3.3 (L)  3.5 - 5.1 mmol/L Final   Chloride 09/22/2023 101  98 - 111 mmol/L Final   CO2 09/22/2023 25  22 - 32 mmol/L Final   Glucose, Bld 09/22/2023 96  70 - 99 mg/dL Final   Glucose reference range applies only to samples taken after fasting for at least 8 hours.   BUN 09/22/2023 9  6 - 20 mg/dL Final   Creatinine, Ser 09/22/2023 0.70  0.61 - 1.24 mg/dL Final   Calcium 78/29/5621 9.3  8.9 - 10.3 mg/dL Final   Total Protein 30/86/5784 8.3 (H)  6.5 - 8.1 g/dL Final   Albumin 69/62/9528 4.6  3.5 - 5.0 g/dL Final   AST 41/32/4401 82 (H)  15 - 41 U/L Final   ALT 09/22/2023 47 (H)  0 - 44 U/L Final   Alkaline Phosphatase 09/22/2023 46  38 - 126 U/L Final   Total Bilirubin 09/22/2023 0.7  <1.2 mg/dL Final   GFR, Estimated 09/22/2023 >60  >60 mL/min Final   Comment: (NOTE) Calculated using the CKD-EPI Creatinine Equation (2021)    Anion gap 09/22/2023 16 (H)  5 - 15 Final   Performed at [facility], [address] [place], [place] [address]   Alcohol, Ethyl (B) 09/22/2023 294 (H)  <10 mg/dL Final   Comment: (NOTE) Lowest detectable limit for serum alcohol is 10 mg/dL.  For medical purposes only. Performed at [facility], [address] [place], [place] [address]    WBC 09/22/2023 3.4 (L)  4.0 - 10.5 K/uL Final   RBC 09/22/2023 4.71  4.22 - 5.81 MIL/uL Final   Hemoglobin 09/22/2023 13.0  13.0 - 17.0 g/dL Final   HCT 03/47/4259 38.3 (L)  39.0 - 52.0 % Final   MCV 09/22/2023 81.3  80.0 - 100.0 fL Final   MCH 09/22/2023 27.6  26.0 - 34.0 pg Final   MCHC 09/22/2023 33.9  30.0 - 36.0 g/dL Final   RDW 56/38/7564 14.6  11.5 - 15.5 % Final   Platelets 09/22/2023 250  150 - 400 K/uL Final   nRBC 09/22/2023 0.0  0.0 - 0.2 % Final   Performed at [facility],  [address] [place], [place] [address]   Tricyclic, Ur Screen 09/22/2023 NONE DETECTED  NONE DETECTED Final   Amphetamines, Ur Screen 09/22/2023 NONE DETECTED  NONE DETECTED Final   MDMA (Ecstasy)Ur Screen 09/22/2023 NONE DETECTED  NONE DETECTED Final   Cocaine Metabolite,Ur [place] 09/22/2023 NONE DETECTED  NONE DETECTED Final   Opiate,  Ur Screen 09/22/2023 NONE DETECTED  NONE DETECTED Final   Phencyclidine (PCP) Ur S 09/22/2023 NONE DETECTED  NONE DETECTED Final   Cannabinoid 50 Ng, Ur [place] 09/22/2023 NONE DETECTED  NONE DETECTED Final   Barbiturates, Ur Screen 09/22/2023 NONE DETECTED  NONE DETECTED Final   Benzodiazepine, Ur Scrn 09/22/2023 NONE DETECTED  NONE DETECTED Final   Methadone Scn, Ur 09/22/2023 NONE DETECTED  NONE DETECTED Final   Comment: (NOTE) Tricyclics + metabolites, urine    Cutoff 1000 ng/mL Amphetamines + metabolites, urine  Cutoff 1000 ng/mL MDMA (Ecstasy), urine              Cutoff 500 ng/mL Cocaine Metabolite, urine          Cutoff 300 ng/mL Opiate + metabolites, urine        Cutoff 300 ng/mL Phencyclidine (PCP), urine         Cutoff 25 ng/mL Cannabinoid, urine                 Cutoff 50 ng/mL Barbiturates + metabolites, urine  Cutoff 200 ng/mL Benzodiazepine, urine              Cutoff 200 ng/mL Methadone, urine                   Cutoff 300 ng/mL  The urine drug screen provides only a preliminary, unconfirmed analytical test result and should not be used for non-medical purposes. Clinical consideration and professional judgment should be applied to any positive drug screen result due to possible interfering substances. A more specific alternate chemical method must be used in order to obtain a confirmed analytical result. Gas chromatography / mass spectrometry (GC/MS) is the preferred confirm                          atory method. Performed at [facility], [address] [place], [place] [address]     PSYCHIATRIC REVIEW OF SYSTEMS (ROS)  -  Patient presented with frustration and impulsivity, as evidenced by threatening self-harm with a knife. - Reported feeling depressed occasionally. - Denied auditory or visual hallucinations. - Admitted to recent alcohol consumption and daily drinking habits. - Was alert and oriented, able to provide personal information. - Expressed concern about missing work and losing his job. - Showed some insight into his behavior, acknowledging frustration as a trigger.  Additional findings:      Musculoskeletal: No abnormal movements observed      Gait & Station: Laying/Sitting      Pain Screening: Denies      Nutrition & Dental Concerns: none reported  RISK FORMULATION/ASSESSMENT  Is the patient experiencing any suicidal or homicidal ideations: No       Explain if yes:  Protective factors considered for safety management: access to appropriate clinical services  Risk factors/concerns considered for safety management:  Prior attempt Depression Substance abuse/dependence Recent loss Impulsivity Male gender  Is there a safety management plan with the patient and treatment team to minimize risk factors and promote protective factors: Yes           Explain: Admit to psych Is crisis care placement or psychiatric hospitalization recommended: Yes     Based on my current evaluation and risk assessment, patient is determined at this time to be at:  Moderate Risk  *RISK ASSESSMENT Risk assessment is a dynamic process; it is possible that this patient's condition, and risk level, may change. This should be re-evaluated and  managed over time as appropriate. Please re-consult psychiatric consult services if additional assistance is needed in terms of risk assessment and management. If your team decides to discharge this patient, please advise the patient how to best access emergency psychiatric services, or to call 911, if their condition worsens or they feel unsafe in any way.  Shamon Ogan is a 42  year old male who presented with a recent episode of threatening self-harm with a knife, attributed to frustration and stress, and involving alcohol consumption. He has a history of depression, a previous suicide attempt two months ago, and daily alcohol use. Hager has not taken his prescribed sertraline for the past week and is also on amlodipine for hypertension. The plan includes admission to the inpatient psychiatric unit for safety, further evaluation, and treatment focusing on suicidal ideation, alcohol use disorder and depression.   Christopher Morton, NP Telepsychiatry Consult Services

## 2023-09-23 NOTE — ED Provider Notes (Signed)
-----------------------------------------   7:18 AM on 09/23/2023 -----------------------------------------  Psychiatry recommends psych admission   Loleta Rose, MD 09/23/23 [phone]

## 2023-09-23 NOTE — BH Assessment (Signed)
Per [facility] AC Debbe Bales M.), during the [facility] Call meeting, the patient is to be referred out.

## 2023-09-23 NOTE — ED Notes (Signed)
Patient accepted per Robinette Haines to [facility] Room 407 Accepted by Dr. Jonna Munro representative

## 2023-09-24 ENCOUNTER — Encounter (HOSPITAL_COMMUNITY): Payer: Self-pay

## 2023-09-24 DIAGNOSIS — F332 Major depressive disorder, recurrent severe without psychotic features: Secondary | ICD-10-CM | POA: Diagnosis not present

## 2023-09-24 MED ORDER — VITAMIN B-1 100 MG PO TABS
100.0000 mg | ORAL_TABLET | Freq: Every day | ORAL | Status: DC
  Administered 2023-09-25 – 2023-09-27 (×3): 100 mg via ORAL
  Filled 2023-09-24 (×4): qty 1

## 2023-09-24 MED ORDER — ADULT MULTIVITAMIN W/MINERALS CH
1.0000 | ORAL_TABLET | Freq: Every day | ORAL | Status: DC
  Administered 2023-09-24 – 2023-09-27 (×4): 1 via ORAL
  Filled 2023-09-24 (×5): qty 1

## 2023-09-24 MED ORDER — LORAZEPAM 1 MG PO TABS: 1.0000 mg | ORAL_TABLET | Freq: Four times a day (QID) | ORAL | Status: AC | PRN

## 2023-09-24 MED ORDER — CLONIDINE HCL 0.1 MG PO TABS: 0.1000 mg | ORAL_TABLET | Freq: Three times a day (TID) | ORAL | Status: DC | PRN

## 2023-09-24 NOTE — Plan of Care (Signed)
  Problem: Safety: Goal: Periods of time without injury will increase Outcome: Progressing   

## 2023-09-24 NOTE — H&P (Addendum)
Psychiatric Admission Assessment Adult  Patient Identification: Christopher Hardin MRN:  [id number] Date of Evaluation:  09/24/2023 Chief Complaint:  MDD (major depressive disorder) [F32.9] Principal Diagnosis: MDD (major depressive disorder), recurrent severe, without psychosis (HCC) Diagnosis:  Principal Problem:   MDD (major depressive disorder), recurrent severe, without psychosis (HCC) Active Problems:   Alcohol use disorder  History of Present Illness:  Christopher Hardin is a 42 yr old male who presented on 11/27 to [facility] by PD due to worsening depression and SI, he was admitted to [facility] on 11/29.  PPHx is significant for Depression and EtOH Abuse and 2 Prior Suicide Attempt (last OD 06/2023) and 1 Prior Psychiatric Hospitalization [facility] 06/2023), and no history of Self Injurious Behavior.  When asked what led to his hospitalization he reports that it was due to his anger.  He reports that unlike when he was previously hospitalized overall he had not been depressed or having anxiety.  He reports that he bought a car last week and it broke down so after spending a significant amount of money he could now also not get around.  He reports that due to this he was ranting to his fiance.  He reports that his fiance was on the phone with her sister and her sister heard him and called the police.  He reports that he was under significant amount of stress and he had not been on his Zoloft for about 1 week due to running out.  He reports that prior to running out of the medication he had been doing significantly better.  He reports he wants to get back on his medication and get better.  He reports a past psychiatric history significant for depression and alcohol abuse.  He reports 2 prior suicide attempts-last via OD 06/2023.  He reports no history of self-injurious behavior.  He reports 1 prior psychiatric hospitalization- [facility] 06/2023.  He reports past medical history significant for hypertension.   He reports past surgical history significant for arm surgery after punching a glass screen door.  He reports no history of head trauma.  He reports no history of seizures.  He reports NKDA.  He reports he currently lives with his fiancee and kids.  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  He reports completing the 11th grade.  He reports drinking less EtOH as he no longer drinks any liquor and only drinks 1 40 ounce beer after work.  He reports that he vapes.  He reports no illicit substance use.  He reports no legal issues.  He reports no access to firearms.  He reports that he is wanting to continue with the Zoloft as it was doing well for him.  Discussed with him that given the acute stressors along with him not being on his medication and alcohol in his system we would like to confirm what happened with his fiance and he was agreeable with this.  Discussed with him that after this was done we would drop the IVC and he would sign voluntarily in for treatment and he was agreeable with this.  He reports no SI, HI, or AVH.  He reports no other concerns present.   Called patient's Franciso Bend, [phone].  She confirms that patient was under a significant acute stressor due to his newly purchased car being "essentially 11."  She confirms that he had been out of his Zoloft for about a week.  She reports that prior to this he had been  doing significantly better since he was discharged from the hospital.  She reports that if he is restarted on his medications she thinks he would not have any safety concerns.  Discussed with her that we would let the patient sign voluntarily into the hospital and continue with his Zoloft.  She reports no other concerns at present.  Associated Signs/Symptoms: Depression Symptoms:  depressed mood, anxiety, (Hypo) Manic Symptoms:   Reports None Anxiety Symptoms:  Excessive Worry, Psychotic Symptoms:   Reports None PTSD  Symptoms: NA Total Time spent with patient: 45 minutes  Past Psychiatric History: Depression and EtOH Abuse and 2 Prior Suicide Attempt (last OD 06/2023) and 1 Prior Psychiatric Hospitalization [facility] 06/2023), and no history of Self Injurious Behavior.  Is the patient at risk to self? Yes.    Has the patient been a risk to self in the past 6 months? Yes.    Has the patient been a risk to self within the distant past? Yes.    Is the patient a risk to others? No.  Has the patient been a risk to others in the past 6 months? No.  Has the patient been a risk to others within the distant past? No.   Christopher Hardin:  Flowsheet Row Ad[facility] (Current) [facility] from 09/22/2023 in [facility] Department at [facility] Counselor from 08/26/2023 in [facility] at [facility]  C-SSRS RISK CATEGORY No Risk No Risk High Risk        Prior Inpatient Therapy: Yes.   If yes, describe [facility]  Prior Outpatient Therapy: No. If yes, describe N/A   Alcohol Screening: 1. How often do you have a drink containing alcohol?: 4 or more times a week 2. How many drinks containing alcohol do you have on a typical day when you are drinking?: 1 or 2 3. How often do you have six or more drinks on one occasion?: Never AUDIT-C Score: 4 4. How often during the last year have you found that you were not able to stop drinking once you had started?: Less than monthly 5. How often during the last year have you failed to do what was normally expected from you because of drinking?: Less than monthly 6. How often during the last year have you needed a first drink in the morning to get yourself going after a heavy drinking session?: Never 7. How often during the last year have you had a feeling of guilt of remorse after drinking?: Less than monthly 8. How often during the last year have you been unable to remember what  happened the night before because you had been drinking?: Less than monthly 9. Have you or someone else been injured as a result of your drinking?: No 10. Has a relative or friend or a doctor or another health worker been concerned about your drinking or suggested you cut down?: Yes, during the last year Alcohol Use Disorder Identification Test Final Score (AUDIT): 12 Alcohol Brief Interventions/Follow-up: Alcohol education/Brief advice Substance Abuse History in the last 12 months:  Yes.   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  No, patient refused Counseled patient on smoking cessation:  Yes Reason Tobacco Screening Not Completed: No value filed.       Social History:  Social History   Substance and Sexual Activity  Alcohol Use Yes   Alcohol/week: 1.0 standard drink of alcohol   Types: 1 Cans of beer per week   Comment: "Some vodka shots on weekends."     Social History   Substance and Sexual Activity  Drug Use No    Additional Social History:                           Allergies:  No Known Allergies Lab Results:  Results for orders placed or performed  during the hospital encounter of 09/22/23 (from the past 48 hour(s))  Urine Drug Screen, Qualitative     Status: None   Collection Time: 09/22/23  5:25 PM  Result Value Ref Range   Tricyclic, Ur Screen NONE DETECTED NONE DETECTED   Amphetamines, Ur Screen NONE DETECTED NONE DETECTED   MDMA (Ecstasy)Ur Screen NONE DETECTED NONE DETECTED   Cocaine Metabolite,Ur [place] NONE DETECTED NONE DETECTED   Opiate, Ur Screen NONE DETECTED NONE DETECTED   Phencyclidine (PCP) Ur S NONE DETECTED NONE DETECTED   Cannabinoid 50 Ng, Ur [place] NONE DETECTED NONE DETECTED   Barbiturates, Ur Screen NONE DETECTED NONE DETECTED   Benzodiazepine, Ur Scrn NONE DETECTED NONE DETECTED   Methadone Scn, Ur NONE DETECTED NONE DETECTED    Comment: (NOTE) Tricyclics + metabolites, urine    Cutoff 1000 ng/mL Amphetamines + metabolites, urine  Cutoff 1000 ng/mL MDMA (Ecstasy), urine              Cutoff 500 ng/mL Cocaine Metabolite, urine          Cutoff 300 ng/mL Opiate + metabolites, urine        Cutoff 300 ng/mL Phencyclidine (PCP), urine         Cutoff 25 ng/mL Cannabinoid, urine                 Cutoff 50 ng/mL Barbiturates + metabolites, urine  Cutoff 200 ng/mL Benzodiazepine, urine              Cutoff 200 ng/mL Methadone, urine                   Cutoff 300 ng/mL  The urine drug screen provides only a preliminary, unconfirmed analytical test result and should not be used for non-medical purposes. Clinical consideration and professional judgment should be applied to any positive drug screen result due to possible interfering substances. A more specific alternate chemical method must be used in order to obtain a confirmed analytical result. Gas chromatography / mass spectrometry (GC/MS) is the preferred confirm atory method. Performed at [facility], [address] [place], [place] [address]   Comprehensive metabolic panel     Status: Abnormal   Collection Time: 09/22/23  7:23 PM  Result Value  Ref Range    Sodium 142 135 - 145 mmol/L   Potassium 3.3 (L) 3.5 - 5.1 mmol/L   Chloride 101 98 - 111 mmol/L   CO2 25 22 - 32 mmol/L   Glucose, Bld 96 70 - 99 mg/dL    Comment: Glucose reference range applies only to samples taken after fasting for at least 8 hours.   BUN 9 6 - 20 mg/dL   Creatinine, Ser 4.09 0.61 - 1.24 mg/dL   Calcium 9.3 8.9 - 81.1 mg/dL   Total Protein 8.3 (H) 6.5 - 8.1 g/dL   Albumin 4.6 3.5 - 5.0 g/dL   AST 82 (H) 15 - 41 U/L   ALT 47 (H) 0 - 44 U/L   Alkaline Phosphatase 46 38 - 126 U/L   Total Bilirubin 0.7 <1.2 mg/dL   GFR, Estimated >91 >47 mL/min    Comment: (NOTE) Calculated using the CKD-EPI Creatinine Equation (2021)    Anion gap 16 (H) 5 - 15    Comment: Performed at [facility], [address], [place], [place] [address]  Ethanol     Status: Abnormal   Collection Time: 09/22/23  7:23 PM  Result Value Ref Range   Alcohol, Ethyl (B) 294 (H) <10 mg/dL    Comment: (NOTE) Lowest detectable limit for serum alcohol is 10 mg/dL.  For medical purposes only. Performed at [facility], [address] [place], [place] [address]   cbc     Status: Abnormal   Collection Time: 09/22/23  7:23 PM  Result Value Ref Range   WBC 3.4 (L) 4.0 - 10.5 K/uL   RBC 4.71 4.22 - 5.81 MIL/uL   Hemoglobin 13.0 13.0 - 17.0 g/dL   HCT 65.7 (L) 84.6 - 96.2 %   MCV 81.3 80.0 - 100.0 fL   MCH 27.6 26.0 - 34.0 pg   MCHC 33.9 30.0 - 36.0 g/dL   RDW 95.2 84.1 - 32.4 %   Platelets 250 150 - 400 K/uL   nRBC 0.0 0.0 - 0.2 %    Comment: Performed at [facility], [address] [place], [place] [address]  Acetaminophen level     Status: Abnormal   Collection Time: 09/23/23  4:09 AM  Result Value Ref Range   Acetaminophen (Tylenol), Serum <10 (L) 10 - 30 ug/mL    Comment: (NOTE) Therapeutic concentrations vary significantly. A range of 10-30 ug/mL  may be an effective concentration for many patients. However, some  are best treated at concentrations  outside of this range. Acetaminophen concentrations >150 ug/mL at 4 hours after ingestion  and >50 ug/mL at 12 hours after ingestion are often associated with  toxic reactions.  Performed at [facility], [address] [place], [place] [address]   Salicylate level     Status: Abnormal   Collection Time: 09/23/23  4:09 AM  Result Value Ref Range   Salicylate Lvl <7.0 (L) 7.0 - 30.0 mg/dL    Comment: Performed at [facility], [address] [place], [place] [address]    Blood Alcohol level:  Lab Results  Component Value Date   ETH 294 (H) 09/22/2023   ETH <10 07/18/2023    Metabolic Disorder Labs:  Lab Results  Component Value Date   HGBA1C 6.4 (H) 07/18/2023   MPG 136.98 07/18/2023   No results found for: "PROLACTIN" Lab Results  Component Value Date   CHOL 346 (H) 07/18/2023   TRIG 45 07/18/2023   HDL >135 07/18/2023  CHOLHDL NOT CALCULATED 07/18/2023   VLDL 9 07/18/2023   LDLCALC NOT CALCULATED 07/18/2023    Current Medications: Current Facility-Administered Medications  Medication Dose Route Frequency Provider Last Rate Last Admin   acetaminophen (TYLENOL) tablet 650 mg  650 mg Oral Q6H PRN [name], NP       alum & mag hydroxide-simeth (MAALOX/MYLANTA) 200-200-20 MG/5ML suspension 30 mL  30 mL Oral Q4H PRN [name], NP       amLODipine (NORVASC) tablet 5 mg  5 mg Oral Daily [name], NP   5 mg at 09/24/23 0747   chlorthalidone (HYGROTON) tablet 25 mg  25 mg Oral Daily [name], NP   25 mg at 09/24/23 0747   cloNIDine (CATAPRES) tablet 0.1 mg  0.1 mg Oral TID PRN [name], MD       haloperidol (HALDOL) tablet 5 mg  5 mg Oral TID PRN [name], NP       And   diphenhydrAMINE (BENADRYL) capsule 50 mg  50 mg Oral TID PRN [name], NP       haloperidol lactate (HALDOL) injection 5 mg  5 mg Intramuscular TID PRN [name], NP       And   diphenhydrAMINE (BENADRYL) injection 50 mg  50  mg Intramuscular TID PRN [name], NP       And   LORazepam (ATIVAN) injection 2 mg  2 mg Intramuscular TID PRN [name], NP       haloperidol lactate (HALDOL) injection 10 mg  10 mg Intramuscular TID PRN [name], NP       And   diphenhydrAMINE (BENADRYL) injection 50 mg  50 mg Intramuscular TID PRN [name], NP       And   LORazepam (ATIVAN) injection 2 mg  2 mg Intramuscular TID PRN [name], NP       hydrOXYzine (ATARAX) tablet 25 mg  25 mg Oral TID PRN [name], NP       LORazepam (ATIVAN) tablet 1 mg  1 mg Oral Q6H PRN [place] Elling, [name], MD       magnesium hydroxide (MILK OF MAGNESIA) suspension 30 mL  30 mL Oral Daily PRN [name], NP       melatonin tablet 3 mg  3 mg Oral QHS PRN [name], [name], NP   3 mg at 09/23/23 2224   multivitamin with minerals tablet 1 tablet  1 tablet Oral Daily [name], MD       sertraline (ZOLOFT) tablet 100 mg  100 mg Oral Daily [name], NP   100 mg at 09/24/23 0747   [START ON 09/25/2023] thiamine (Vitamin B-1) tablet 100 mg  100 mg Oral Daily Jessee Newnam, [name], MD       PTA Medications: Medications Prior to Admission  Medication Sig Dispense Refill Last Dose   amLODipine (NORVASC) 5 MG tablet Take 1 tablet (5 mg total) by mouth daily for 14 days. 14 tablet 0    chlorthalidone (HYGROTON) 25 MG tablet Take 25 mg by mouth daily.      sertraline (ZOLOFT) 100 MG tablet Take 1 tablet (100 mg total) by mouth daily. 30 tablet 0    traZODone (DESYREL) 50 MG tablet Take 1 tablet (50 mg total) by mouth at bedtime as needed for sleep. 14 tablet 0     Musculoskeletal: Strength & Muscle Tone: within normal limits  Gait & Station: normal Patient leans: N/A            Psychiatric Specialty Exam:  Presentation  General Appearance:  Appropriate for Environment; Casual  Eye Contact: Good  Speech: Clear and Coherent; Normal Rate  Speech  Volume: Normal  Handedness: Right   Mood and Affect  Mood: Dysphoric  Affect: Congruent   Thought Process  Thought Processes: Coherent; Goal Directed  Duration of Psychotic Symptoms:N/A Past Diagnosis of Schizophrenia or Psychoactive disorder: No  Descriptions of Associations:Intact  Orientation:Full (Time, Place and Person)  Thought Content:Logical; WDL  Hallucinations:Hallucinations: None  Ideas of Reference:None  Suicidal Thoughts:Suicidal Thoughts: No  Homicidal Thoughts:Homicidal Thoughts: No   Sensorium  Memory: Immediate Good; Recent Good  Judgment: Fair  Insight: Fair   Art therapist  Concentration: Good  Attention Span: Good  Recall: Good  Fund of Knowledge: Good  Language: Good   Psychomotor Activity  Psychomotor Activity: Psychomotor Activity: Normal   Assets  Assets: Communication Skills; Desire for Improvement; Housing; Resilience; Social Support   Sleep  Sleep: Sleep: Fair    Physical Exam: Physical Exam Vitals and nursing note reviewed.  Constitutional:      General: He is not in acute distress.    Appearance: Normal appearance. He is normal weight. He is not ill-appearing or toxic-appearing.  HENT:     Head: Normocephalic and atraumatic.  Pulmonary:     Effort: Pulmonary effort is normal.  Musculoskeletal:        General: Normal range of motion.  Neurological:     General: No focal deficit present.     Mental Status: He is alert.    Review of Systems  Respiratory:  Negative for cough and shortness of breath.   Cardiovascular:  Negative for chest pain.  Gastrointestinal:  Negative for abdominal pain, constipation, diarrhea, nausea and vomiting.  Neurological:  Negative for dizziness, weakness and headaches.  Psychiatric/Behavioral:  Positive for depression and substance abuse. Negative for hallucinations and suicidal ideas. The patient is not nervous/anxious.    Blood pressure (!) 150/110,  pulse 98, temperature 98.4 F (36.9 C), temperature source Oral, resp. rate 18, height 6' (1.829 m), weight 70 kg, SpO2 99%. Body mass index is 20.94 kg/m.  Treatment Plan Summary: Daily contact with patient to assess and evaluate symptoms and progress in treatment and Medication management  Harrington Kayn Gmerek is a 42 yr old male who presented on 11/27 to [facility] by PD due to worsening depression and SI, he was admitted to [facility] on 11/29.  PPHx is significant for Depression and EtOH Abuse and 2 Prior Suicide Attempt (last OD 06/2023) and 1 Prior Psychiatric Hospitalization [facility] 06/2023), and no history of Self Injurious Behavior.    Christopher Hardin had not been on his medication for about 1 week and had been using EtOH and in combination with his car breaking down overwhelmed him and lead to his actions.  Both he and his fiancee report that he had been doing much better on the Zoloft so we will continue with this.  As he is willing to get treatment we will drop the IVC and have him sign voluntarily in.  He had low potassium and elevated liver enzymes on his CMP so we will recheck a CMP tomorrow morning to monitor.  Due to his EtOH se we will start CIWA to monitor for significant withdrawal symptoms.  We will not make any changes to his medications at this time.  We will continue to monitor.    MDD,  Recurrent, Severe, w/out Psychosis: -Continue Zoloft 100 mg daily for depression -Continue Agitation Protocol: Haldol/Ativan/Benadryl   Withdrawal: -Start CIWA, last score @CIWA @ -Start Ativan 1 mg q6 PRN CIWA>10 -Start Thiamine 100 mg daily for nutritional supplementation -Start Multivitamin daily for nutritional supplementation    HTN: -Continue Amlodipine 5 mg daily -Continue Chlorthalidone 25 mg daily -Start Clonidine 0.1 mg TID PRN SBP > 180 or DBP > 100   Hypokalemia: -K: 3.3 -Redraw CMP tomorrow morning    -Continue Melatonin 3 mg QHS for sleep -Continue PRN's: Tylenol, Maalox, Atarax,  Milk of Magnesia, Trazodone    Observation Level/Precautions:  15 minute checks  Laboratory:  CMP: WNL except K: 3.3,  Tot Prot: 8.3,  AST: 82, ALT: 47, Anion Gap: 16,  CBC: WNL except WBC: 3.4,  HCT: 38.3, EtOH: 294, Acetaminophen/Salicylate: WNL  Psychotherapy:    Medications:  Zoloft  Consultations:    Discharge Concerns:    Estimated LOS: 3-5 days  Other:     Physician Treatment Plan for Primary Diagnosis: MDD (major depressive disorder), recurrent severe, without psychosis (HCC) Long Term Goal(s): Improvement in symptoms so as ready for discharge  Short Term Goals: Ability to identify changes in lifestyle to reduce recurrence of condition will improve, Ability to disclose and discuss suicidal ideas, Ability to demonstrate self-control will improve, Ability to identify and develop effective coping behaviors will improve, Ability to maintain clinical measurements within normal limits will improve, and Ability to identify triggers associated with substance abuse/mental health issues will improve  Physician Treatment Plan for Secondary Diagnosis: Principal Problem:   MDD (major depressive disorder), recurrent severe, without psychosis (HCC) Active Problems:   Alcohol use disorder  Long Term Goal(s): Improvement in symptoms so as ready for discharge  Short Term Goals: Ability to identify changes in lifestyle to reduce recurrence of condition will improve, Ability to disclose and discuss suicidal ideas, Ability to demonstrate self-control will improve, Ability to identify and develop effective coping behaviors will improve, Ability to maintain clinical measurements within normal limits will improve, and Ability to identify triggers associated with substance abuse/mental health issues will improve  I certify that inpatient services furnished can reasonably be expected to improve the patient's condition.    [name], MD 11/29/202410:50 AM

## 2023-09-24 NOTE — Group Note (Signed)
Date:  09/24/2023 Time:  4:58 PM  Group Topic/Focus:  Making Healthy Choices:   The focus of this group is to help patients identify negative/unhealthy choices they were using prior to admission and identify positive/healthier coping strategies to replace them upon discharge.    Participation Level:  Active  Participation Quality:  Appropriate  Affect:  Appropriate  Cognitive:  Appropriate  Insight: Appropriate  Engagement in Group:  Engaged  Modes of Intervention:  Education  Additional Comments:     Reymundo Poll 09/24/2023, 4:58 PM

## 2023-09-24 NOTE — Progress Notes (Signed)
   09/24/23 0900  Psych Admission Type (Psych Patients Only)  Admission Status Involuntary  Psychosocial Assessment  Patient Complaints None  Eye Contact Fair  Facial Expression Sad;Worried  Affect Depressed  Speech Logical/coherent  Interaction Guarded  Motor Activity Slow  Appearance/Hygiene In scrubs  Behavior Characteristics Cooperative;Calm  Mood Depressed  Thought Process  Coherency WDL  Content WDL  Delusions None reported or observed  Perception WDL  Hallucination None reported or observed  Judgment Impaired  Confusion None  Danger to Self  Current suicidal ideation? Denies  Agreement Not to Harm Self Yes  Description of Agreement verbal  Danger to Others  Danger to Others None reported or observed

## 2023-09-24 NOTE — Progress Notes (Signed)
   09/24/23 0553  15 Minute Checks  Location Bedroom  Visual Appearance Calm  Behavior Sleeping  Sleep (Behavioral Health Patients Only)  Calculate sleep? (Click Yes once per 24 hr at 0600 safety check) Yes  Documented sleep last 24 hours 7.25

## 2023-09-24 NOTE — Progress Notes (Signed)
   09/23/23 2240  Psych Admission Type (Psych Patients Only)  Admission Status Involuntary  Psychosocial Assessment  Patient Complaints Depression  Eye Contact Fair  Facial Expression Sad  Affect Depressed  Speech Logical/coherent  Interaction Minimal  Motor Activity Slow  Appearance/Hygiene Unremarkable  Behavior Characteristics Cooperative  Mood Depressed  Thought Process  Coherency WDL  Content WDL  Delusions None reported or observed  Perception WDL  Hallucination None reported or observed  Judgment Impaired  Confusion None  Danger to Self  Current suicidal ideation? Denies  Agreement Not to Harm Self Yes  Description of Agreement verbal  Danger to Others  Danger to Others None reported or observed

## 2023-09-24 NOTE — Group Note (Signed)
Recreation Therapy Group Note   Group Topic:Problem Solving  Group Date: 09/24/2023 Start Time: 0930 End Time: 1000 Facilitators: Myleen Brailsford-McCall, LRT,CTRS Location: [address] Dayroom   Group Topic: Problem Solving  Goal Area(s) Addresses:  Patient will effectively work in a team with other group members. Patient will verbalize importance of using appropriate problem solving techniques.  Patient will identify positive change associated with effective problem solving skills.   Intervention: Worksheets, Pencils  Group Description: Dentist. Patients were given two sheets of brain teasers. Patients were given 20 minutes to try and figure out as many of teasers they could. Patients were also allowed to work together if they chose to. Once patients finished, LRT would go over the answers with the patients.    Education Outcome: Acknowledges understanding/In group clarification offered/Needs additional education.    Clinical Observations/Individualized Feedback: Due to previous group going over/exceeding time, recreation therapy group was unable to be held at scheduled time.     Plan: Continue to engage patient in RT group sessions 2-3x/week.   Christopher Hardin, LRT,CTRS 09/24/2023 12:53 PM

## 2023-09-24 NOTE — Group Note (Unsigned)
Date:  09/24/2023 Time:  10:18 AM  Group Topic/Focus:  Orientation:   The focus of this group is to educate the patient on the purpose and policies of crisis stabilization and provide a format to answer questions about their admission.  The group details unit policies and expectations of patients while admitted.     Participation Level:  {BHH PARTICIPATION WGNFA:21308}  Participation Quality:  {BHH PARTICIPATION QUALITY:22265}  Affect:  {BHH AFFECT:22266}  Cognitive:  {BHH COGNITIVE:22267}  Insight: {BHH Insight2:20797}  Engagement in Group:  {BHH ENGAGEMENT IN MVHQI:69629}  Modes of Intervention:  {BHH MODES OF INTERVENTION:22269}  Additional Comments:  ***  Reymundo Poll 09/24/2023, 10:18 AM
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
## 2023-09-24 NOTE — BHH Suicide Risk Assessment (Signed)
[facility] Admission Suicide Risk Assessment   Nursing information obtained from:  Patient Demographic factors:  Male Current Mental Status:  Suicidal ideation indicated by others, Self-harm thoughts, Self-harm behaviors Loss Factors:  NA Historical Factors:  Prior suicide attempts, Impulsivity Risk Reduction Factors:  Responsible for children under 41 years of age, Living with another person, especially a relative, Positive social support, Positive therapeutic relationship, Sense of responsibility to family  Total Time spent with patient: 45 minutes Principal Problem: MDD (major depressive disorder), recurrent severe, without psychosis (HCC) Diagnosis:  Principal Problem:   MDD (major depressive disorder), recurrent severe, without psychosis (HCC) Active Problems:   Alcohol use disorder  Subjective Data:  Lin Krook is a 42 yr old male who presented on 11/27 to [facility] by PD due to worsening depression and SI, he was admitted to [facility] on 11/29.  PPHx is significant for Depression and EtOH Abuse and 2 Prior Suicide Attempt (last OD 06/2023) and 1 Prior Psychiatric Hospitalization [facility] 06/2023), and no history of Self Injurious Behavior.   When asked what led to his hospitalization he reports that it was due to his anger.  He reports that unlike when he was previously hospitalized overall he had not been depressed or having anxiety.  He reports that he bought a car last week and it broke down so after spending a significant amount of money he could now also not get around.  He reports that due to this he was ranting to his fiance.  He reports that his fiance was on the phone with her sister and her sister heard him and called the police.  He reports that he was under significant amount of stress and he had not been on his Zoloft for about 1 week due to running out.  He reports that prior to running out of the medication he had been doing significantly better.  He reports he wants to get back on  his medication and get better.   He reports a past psychiatric history significant for depression and alcohol abuse.  He reports 2 prior suicide attempts-last via OD 06/2023.  He reports no history of self-injurious behavior.  He reports 1 prior psychiatric hospitalization- [facility] 06/2023.  He reports past medical history significant for hypertension.  He reports past surgical history significant for arm surgery after punching a glass screen door.  He reports no history of head trauma.  He reports no history of seizures.  He reports NKDA.   He reports he currently lives with his fiancee and kids.  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  He reports completing the 11th grade.  He reports drinking less EtOH as he no longer drinks any liquor and only drinks 1 40 ounce beer after work.  He reports that he vapes.  He reports no illicit substance use.  He reports no legal issues.  He reports no access to firearms.   He reports that he is wanting to continue with the Zoloft as it was doing well for him.  Discussed with him that given the acute stressors along with him not being on his medication and alcohol in his system we would like to confirm what happened with his fiance and he was agreeable with this.  Discussed with him that after this was done we would drop the IVC and he would sign voluntarily in for treatment and he was agreeable with this.  He reports no SI, HI, or AVH.  He reports no other  concerns present.     Called patient's Franciso [place], [phone].  She confirms that patient was under a significant acute stressor due to his newly purchased car being "essentially 11."  She confirms that he had been out of his Zoloft for about a week.  She reports that prior to this he had been doing significantly better since he was discharged from the hospital.  She reports that if he is restarted on his medications she thinks he would not have any safety concerns.  Discussed  with her that we would let the patient sign voluntarily into the hospital and continue with his Zoloft.  She reports no other concerns at present.  Continued Clinical Symptoms:  Alcohol Use Disorder Identification Test Final Score (AUDIT): 12 The "Alcohol Use Disorders Identification Test", Guidelines for Use in Primary Care, Second Edition.  World Science writer [facility]). Score between 0-7:  no or low risk or alcohol related problems. Score between 8-15:  moderate risk of alcohol related problems. Score between 16-19:  high risk of alcohol related problems. Score 20 or above:  warrants further diagnostic evaluation for alcohol dependence and treatment.   CLINICAL FACTORS:   Depression:   Comorbid alcohol abuse/dependence Alcohol/Substance Abuse/Dependencies More than one psychiatric diagnosis Previous Psychiatric Diagnoses and Treatments   Musculoskeletal: Strength & Muscle Tone: within normal limits Gait & Station: normal Patient leans: N/A  Psychiatric Specialty Exam:  Presentation  General Appearance:  Appropriate for Environment; Casual  Eye Contact: Good  Speech: Clear and Coherent; Normal Rate  Speech Volume: Normal  Handedness: Right   Mood and Affect  Mood: Dysphoric  Affect: Congruent   Thought Process  Thought Processes: Coherent; Goal Directed  Descriptions of Associations:Intact  Orientation:Full (Time, Place and Person)  Thought Content:Logical; WDL  History of Schizophrenia/Schizoaffective disorder:No  Duration of Psychotic Symptoms:No data recorded Hallucinations:Hallucinations: None  Ideas of Reference:None  Suicidal Thoughts:Suicidal Thoughts: No  Homicidal Thoughts:Homicidal Thoughts: No   Sensorium  Memory: Immediate Good; Recent Good  Judgment: Fair  Insight: Fair   Art therapist  Concentration: Good  Attention Span: Good  Recall: Good  Fund of Knowledge: Good  Language: Good   Psychomotor  Activity  Psychomotor Activity: Psychomotor Activity: Normal   Assets  Assets: Communication Skills; Desire for Improvement; Housing; Resilience; Social Support   Sleep  Sleep: Sleep: Fair    Physical Exam: Physical Exam Vitals and nursing note reviewed.  Constitutional:      General: He is not in acute distress.    Appearance: Normal appearance. He is normal weight. He is not ill-appearing or toxic-appearing.  HENT:     Head: Normocephalic and atraumatic.  Pulmonary:     Effort: Pulmonary effort is normal.  Musculoskeletal:        General: Normal range of motion.  Neurological:     General: No focal deficit present.     Mental Status: He is alert.    Review of Systems  Respiratory:  Negative for cough and shortness of breath.   Cardiovascular:  Negative for chest pain.  Gastrointestinal:  Negative for abdominal pain, constipation, diarrhea, nausea and vomiting.  Neurological:  Negative for dizziness, weakness and headaches.  Psychiatric/Behavioral:  Positive for depression and substance abuse. Negative for hallucinations and suicidal ideas. The patient is not nervous/anxious.    Blood pressure (!) 150/110, pulse 98, temperature 98.4 F (36.9 C), temperature source Oral, resp. rate 18, height 6' (1.829 m), weight 70 kg, SpO2 99%. Body mass index is 20.94 kg/m.   COGNITIVE  FEATURES THAT CONTRIBUTE TO RISK:  None    SUICIDE RISK:   Mild:  Suicidal ideation of limited frequency, intensity, duration, and specificity.  There are no identifiable plans, no associated intent, mild dysphoria and related symptoms, good self-control (both objective and subjective assessment), few other risk factors, and identifiable protective factors, including available and accessible social support.  PLAN OF CARE:  Langford Tygart is a 42 yr old male who presented on 11/27 to [facility] by PD due to worsening depression and SI, he was admitted to [facility] on 11/29.  PPHx is significant for  Depression and EtOH Abuse and 2 Prior Suicide Attempt (last OD 06/2023) and 1 Prior Psychiatric Hospitalization [facility] 06/2023), and no history of Self Injurious Behavior.      Aijalon had not been on his medication for about 1 week and had been using EtOH and in combination with his car breaking down overwhelmed him and lead to his actions.  Both he and his fiancee report that he had been doing much better on the Zoloft so we will continue with this.  As he is willing to get treatment we will drop the IVC and have him sign voluntarily in.  He had low potassium and elevated liver enzymes on his CMP so we will recheck a CMP tomorrow morning to monitor.  Due to his EtOH se we will start CIWA to monitor for significant withdrawal symptoms.  We will not make any changes to his medications at this time.  We will continue to monitor.      MDD, Recurrent, Severe, w/out Psychosis: -Continue Zoloft 100 mg daily for depression -Continue Agitation Protocol: Haldol/Ativan/Benadryl     Withdrawal: -Start CIWA, last score @CIWA @ -Start Ativan 1 mg q6 PRN CIWA>10 -Start Thiamine 100 mg daily for nutritional supplementation -Start Multivitamin daily for nutritional supplementation       HTN: -Continue Amlodipine 5 mg daily -Continue Chlorthalidone 25 mg daily -Start Clonidine 0.1 mg TID PRN SBP > 180 or DBP > 100     Hypokalemia: -K: 3.3 -Redraw CMP tomorrow morning      -Continue Melatonin 3 mg QHS for sleep -Continue PRN's: Tylenol, Maalox, Atarax, Milk of Magnesia, Trazodone   I certify that inpatient services furnished can reasonably be expected to improve the patient's condition.   [name], MD 09/24/2023, 10:54 AM

## 2023-09-24 NOTE — BHH Group Notes (Signed)
BHH Group Notes:  (Nursing/MHT/Case Management/Adjunct)  Date:  09/24/2023  Time:  1:26 AM  Type of Therapy:   Wrap-up group  Participation Level:  Did Not Attend  Participation Quality:    Affect:    Cognitive:    Insight:    Engagement in Group:    Modes of Intervention:    Summary of Progress/Problems:  Noah Delaine 09/24/2023, 1:26 AM

## 2023-09-24 NOTE — BH IP Treatment Plan (Signed)
Interdisciplinary Treatment and Diagnostic Plan Update  09/24/2023 Time of Session: 11:15 AM Christopher Hardin MRN: [id number]  Principal Diagnosis: MDD (major depressive disorder), recurrent severe, without psychosis (HCC)  Secondary Diagnoses: Principal Problem:   MDD (major depressive disorder), recurrent severe, without psychosis (HCC) Active Problems:   Alcohol use disorder   Current Medications:  Current Facility-Administered Medications  Medication Dose Route Frequency Provider Last Rate Last Admin   acetaminophen (TYLENOL) tablet 650 mg  650 mg Oral Q6H PRN Christopher Rack, Hardin       alum & mag hydroxide-simeth (MAALOX/MYLANTA) 200-200-20 MG/5ML suspension 30 mL  30 mL Oral Q4H PRN Christopher Rack, Hardin       amLODipine (NORVASC) tablet 5 mg  5 mg Oral Daily Christopher Rack, Hardin   5 mg at 09/24/23 0747   chlorthalidone (HYGROTON) tablet 25 mg  25 mg Oral Daily Christopher Rack, Hardin   25 mg at 09/24/23 0747   cloNIDine (CATAPRES) tablet 0.1 mg  0.1 mg Oral TID PRN Christopher Franklin, Hardin       haloperidol (HALDOL) tablet 5 mg  5 mg Oral TID PRN Christopher Rack, Hardin       And   diphenhydrAMINE (BENADRYL) capsule 50 mg  50 mg Oral TID PRN Christopher Rack, Hardin       haloperidol lactate (HALDOL) injection 5 mg  5 mg Intramuscular TID PRN Christopher Rack, Hardin       And   diphenhydrAMINE (BENADRYL) injection 50 mg  50 mg Intramuscular TID PRN Christopher Rack, Hardin       And   LORazepam (ATIVAN) injection 2 mg  2 mg Intramuscular TID PRN Christopher Rack, Hardin       haloperidol lactate (HALDOL) injection 10 mg  10 mg Intramuscular TID PRN Christopher Rack, Hardin       And   diphenhydrAMINE (BENADRYL) injection 50 mg  50 mg Intramuscular TID PRN Christopher Rack, Hardin       And   LORazepam (ATIVAN) injection 2 mg  2 mg Intramuscular TID PRN Christopher Rack, Hardin       hydrOXYzine (ATARAX) tablet 25 mg  25 mg Oral TID PRN Christopher Rack, Hardin       LORazepam (ATIVAN) tablet 1 mg  1 mg Oral Q6H PRN  Christopher Hardin       magnesium hydroxide (MILK OF MAGNESIA) suspension 30 mL  30 mL Oral Daily PRN Christopher Rack, Hardin       melatonin tablet 3 mg  3 mg Oral QHS PRN Christopher Hardin   3 mg at 09/23/23 2224   multivitamin with minerals tablet 1 tablet  1 tablet Oral Daily Christopher Franklin, Hardin       sertraline (ZOLOFT) tablet 100 mg  100 mg Oral Daily Christopher Rack, Hardin   100 mg at 09/24/23 0747   [START ON 09/25/2023] thiamine (Vitamin B-1) tablet 100 mg  100 mg Oral Daily Christopher Hardin       PTA Medications: Medications Prior to Admission  Medication Sig Dispense Refill Last Dose   amLODipine (NORVASC) 5 MG tablet Take 1 tablet (5 mg total) by mouth daily for 14 days. 14 tablet 0    chlorthalidone (HYGROTON) 25 MG tablet Take 25 mg by mouth daily.      sertraline (ZOLOFT) 100 MG tablet Take 1 tablet (100 mg total) by mouth daily. 30 tablet  0    traZODone (DESYREL) 50 MG tablet Take 1 tablet (50 mg total) by mouth at bedtime as needed for sleep. 14 tablet 0     Patient Stressors: Financial difficulties   Marital or family conflict   Medication change or noncompliance    Patient Strengths: Ability for insight  Average or above average intelligence  Communication skills  General fund of knowledge  Motivation for treatment/growth  Supportive family/friends   Treatment Modalities: Medication Management, Group therapy, Case management,  1 to 1 session with clinician, Psychoeducation, Recreational therapy.   Physician Treatment Plan for Primary Diagnosis: MDD (major depressive disorder), recurrent severe, without psychosis (HCC) Long Term Goal(s): Improvement in symptoms so as ready for discharge   Short Term Goals: Ability to identify changes in lifestyle to reduce recurrence of condition will improve Ability to disclose and discuss suicidal ideas Ability to demonstrate self-control will improve Ability to identify and develop effective coping  behaviors will improve Ability to maintain clinical measurements within normal limits will improve Ability to identify triggers associated with substance abuse/mental health issues will improve  Medication Management: Evaluate patient's response, side effects, and tolerance of medication regimen.  Therapeutic Interventions: 1 to 1 sessions, Unit Group sessions and Medication administration.  Evaluation of Outcomes: Not Progressing  Physician Treatment Plan for Secondary Diagnosis: Principal Problem:   MDD (major depressive disorder), recurrent severe, without psychosis (HCC) Active Problems:   Alcohol use disorder  Long Term Goal(s): Improvement in symptoms so as ready for discharge   Short Term Goals: Ability to identify changes in lifestyle to reduce recurrence of condition will improve Ability to disclose and discuss suicidal ideas Ability to demonstrate self-control will improve Ability to identify and develop effective coping behaviors will improve Ability to maintain clinical measurements within normal limits will improve Ability to identify triggers associated with substance abuse/mental health issues will improve     Medication Management: Evaluate patient's response, side effects, and tolerance of medication regimen.  Therapeutic Interventions: 1 to 1 sessions, Unit Group sessions and Medication administration.  Evaluation of Outcomes: Not Progressing   RN Treatment Plan for Primary Diagnosis: MDD (major depressive disorder), recurrent severe, without psychosis (HCC) Long Term Goal(s): Knowledge of disease and therapeutic regimen to maintain health will improve  Short Term Goals: Ability to remain free from injury will improve, Ability to verbalize frustration and anger appropriately will improve, Ability to demonstrate self-control, Ability to participate in decision making will improve, Ability to verbalize feelings will improve, Ability to disclose and discuss suicidal  ideas, Ability to identify and develop effective coping behaviors will improve, and Compliance with prescribed medications will improve  Medication Management: RN will administer medications as ordered by provider, will assess and evaluate patient's response and provide education to patient for prescribed medication. RN will report any adverse and/or side effects to prescribing provider.  Therapeutic Interventions: 1 on 1 counseling sessions, Psychoeducation, Medication administration, Evaluate responses to treatment, Monitor vital signs and CBGs as ordered, Perform/monitor CIWA, COWS, AIMS and Fall Risk screenings as ordered, Perform wound care treatments as ordered.  Evaluation of Outcomes: Not Progressing   LCSW Treatment Plan for Primary Diagnosis: MDD (major depressive disorder), recurrent severe, without psychosis (HCC) Long Term Goal(s): Safe transition to appropriate next level of care at discharge, Engage patient in therapeutic group addressing interpersonal concerns.  Short Term Goals: Engage patient in aftercare planning with referrals and resources, Increase social support, Increase ability to appropriately verbalize feelings, Increase emotional regulation, Facilitate acceptance of mental health diagnosis  and concerns, Facilitate patient progression through stages of change regarding substance use diagnoses and concerns, Identify triggers associated with mental health/substance abuse issues, and Increase skills for wellness and recovery  Therapeutic Interventions: Assess for all discharge needs, 1 to 1 time with Social worker, Explore available resources and support systems, Assess for adequacy in community support network, Educate family and significant other(s) on suicide prevention, Complete Psychosocial Assessment, Interpersonal group therapy.  Evaluation of Outcomes: Not Progressing   Progress in Treatment: Attending groups: Yes. Participating in groups: Yes. Taking medication  as prescribed: Yes. Toleration medication: Yes. Family/Significant other contact made: Yes, individual(s) contacted:  Bobetta Lime, fiance [phone] Patient understands diagnosis: Yes. Discussing patient identified problems/goals with staff: Yes. Medical problems stabilized or resolved: Yes. Denies suicidal/homicidal ideation: Yes. Issues/concerns per patient self-inventory: No. Other: none reported  New problem(s) identified: No, Describe:  none reported  New Short Term/Long Term Goal(s):   Patient Goals:  " I would like to work on being positive, enjoying the day and getting back on medications"  Discharge Plan or Barriers: Patient recently admitted. CSW will continue to follow and assess for appropriate referrals and possible discharge planning.    Reason for Continuation of Hospitalization: Anxiety Depression Suicidal ideation  Estimated Length of Stay: 5-7 days  Last 3 [place] Suicide Severity Risk Score: Flowsheet Row [facility] ([facility]22/2023 in [facility] Counselor from 08/26/2023 in [facility]  C-SSRS RISK CATEGORY No Risk No Risk High Risk       Last PHQ 2/9 Scores:    08/26/2023    1:40 PM  Depression screen PHQ 2/9  Decreased Interest 0  Down, Depressed, Hopeless 0  PHQ - 2 Score 0    Scribe for Treatment Team: Kathrynn Humble 09/24/2023 11:20 AM

## 2023-09-24 NOTE — Plan of Care (Signed)
  Problem: Education: Goal: Emotional status will improve Outcome: Progressing Goal: Mental status will improve Outcome: Progressing Goal: Verbalization of understanding the information provided will improve Outcome: Progressing   Problem: Activity: Goal: Interest or engagement in activities will improve Outcome: Progressing   

## 2023-09-24 NOTE — Progress Notes (Signed)
   09/24/23 2214  Psych Admission Type (Psych Patients Only)  Admission Status Involuntary  Psychosocial Assessment  Patient Complaints None  Eye Contact Fair  Facial Expression Flat  Affect Appropriate to circumstance  Speech Logical/coherent  Interaction Guarded  Motor Activity Other (Comment) (WDL)  Appearance/Hygiene In scrubs  Behavior Characteristics Appropriate to situation  Mood Pleasant  Thought Process  Coherency WDL  Content WDL  Delusions None reported or observed  Perception WDL  Hallucination None reported or observed  Judgment Impaired  Confusion None  Danger to Self  Current suicidal ideation? Denies  Agreement Not to Harm Self Yes  Description of Agreement verbal  Danger to Others  Danger to Others None reported or observed

## 2023-09-25 DIAGNOSIS — F332 Major depressive disorder, recurrent severe without psychotic features: Secondary | ICD-10-CM | POA: Diagnosis not present

## 2023-09-25 LAB — COMPREHENSIVE METABOLIC PANEL
ALT: 36 U/L (ref 0–44)
AST: 53 U/L — ABNORMAL HIGH (ref 15–41)
Albumin: 4.7 g/dL (ref 3.5–5.0)
Alkaline Phosphatase: 42 U/L (ref 38–126)
Anion gap: 10 (ref 5–15)
BUN: 16 mg/dL (ref 6–20)
CO2: 27 mmol/L (ref 22–32)
Calcium: 10 mg/dL (ref 8.9–10.3)
Chloride: 97 mmol/L — ABNORMAL LOW (ref 98–111)
Creatinine, Ser: 0.96 mg/dL (ref 0.61–1.24)
GFR, Estimated: >60 mL/min (ref >60)
Glucose, Bld: 129 mg/dL — ABNORMAL HIGH (ref 70–99)
Potassium: 3.1 mmol/L — ABNORMAL LOW (ref 3.5–5.1)
Sodium: 134 mmol/L — ABNORMAL LOW (ref 135–145)
Total Bilirubin: 0.7 mg/dL (ref <1.2)
Total Protein: 8.6 g/dL — ABNORMAL HIGH (ref 6.5–8.1)

## 2023-09-25 NOTE — Plan of Care (Signed)
  Problem: Education: Goal: Mental status will improve Outcome: Progressing Goal: Verbalization of understanding the information provided will improve Outcome: Progressing   Problem: Activity: Goal: Interest or engagement in activities will improve Outcome: Progressing   

## 2023-09-25 NOTE — [facility] Group Notes (Signed)
[facility] Group Notes:  (Nursing/MHT/Case Management/Adjunct)  Date:  09/25/2023  Time:  2000  Type of Therapy:   wrap up group  Participation Level:  Active  Participation Quality:  Appropriate and Attentive  Affect:  Appropriate  Cognitive:  Alert and Appropriate  Insight:  Appropriate  Engagement in Group:  Engaged and Supportive  Modes of Intervention:  Discussion, Education, and Support  Summary of Progress/Problems: Pt rated his day a 10. His goal were to lower his blood pressure. Pt stated he did archive his goal.  Fay Records 09/25/2023, 8:53 PM

## 2023-09-25 NOTE — [facility] Group Notes (Signed)
[facility] Group Notes:  (Nursing/MHT/Case Management/Adjunct)  Date:  09/25/2023  Time:  2:37 PM  Type of Therapy:  Psychoeducational Skills  Participation Level:  Active  Participation Quality:  Appropriate  Affect:  Appropriate  Cognitive:  Alert and Appropriate  Insight:  Appropriate  Engagement in Group:  Engaged  Modes of Intervention:  Discussion, Education, and Exploration  Summary of Progress/Problems: Pts were educated on the impact of negative thinking, positive reframing and the power of mindfulness. Pts were allowed to discuss one negative thought/habit or coping skill they would like to change to impact their mental health. Pt attended and was appropriate.   Christopher Hardin 09/25/2023, 2:37 PM

## 2023-09-25 NOTE — Group Note (Signed)
Date:  09/25/2023 Time:  11:35 AM  Group Topic/Focus:  Goals Group:   The focus of this group is to help patients establish daily goals to achieve during treatment and discuss how the patient can incorporate goal setting into their daily lives to aide in recovery. Orientation:   The focus of this group is to educate the patient on the purpose and policies of crisis stabilization and provide a format to answer questions about their admission.  The group details unit policies and expectations of patients while admitted.    Participation Level:  Active  Participation Quality:  Appropriate  Affect:  Appropriate  Cognitive:  Appropriate  Insight: Appropriate  Engagement in Group:  Engaged  Modes of Intervention:  Discussion  Additional Comments:    Jeoffrey Eleazer D Apolinar Bero 09/25/2023, 11:35 AM

## 2023-09-25 NOTE — [facility] Suicide Risk Assessment (Signed)
[facility] INPATIENT:  Family/Significant Other Suicide Prevention Education  Suicide Prevention Education:  Education Completed; Christopher Hardin (fiance) [phone],  (name of family member/significant other) has been identified by the patient as the family member/significant other with whom the patient will be residing, and identified as the person(s) who will aid the patient in the event of a mental health crisis (suicidal ideations/suicide attempt).    Patient's fiancee expressed some familiarity with suicidal ideation and warning signs.  She confirmed there are no firearms in the home.  She will be locking up sharps and administering the patient's medication, since he was off his medicine for 1 week without her knowledge prior to this hospital stay.  She asks that medications be called in to [facility] in [place].  With written consent from the patient, the family member/significant other has been provided the following suicide prevention education, prior to the and/or following the discharge of the patient.  The suicide prevention education provided includes the following: Suicide risk factors Suicide prevention and interventions National Suicide Hotline telephone number [facility] assessment telephone number [facility] Emergency Assistance 911 [facility] and/or Residential Mobile Crisis Unit telephone number  Request made of family/significant other to: Remove weapons (e.g., guns, rifles, knives), all items previously/currently identified as safety concern.   Remove drugs/medications (over-the-counter, prescriptions, illicit drugs), all items previously/currently identified as a safety concern.  The family member/significant other verbalizes understanding of the suicide prevention education information provided.  The family member/significant other agrees to remove the items of safety concern listed above.  Christopher Hardin 09/25/2023, 1:59 PM

## 2023-09-25 NOTE — Progress Notes (Signed)
   09/25/23 0800  Psych Admission Type (Psych Patients Only)  Admission Status Involuntary  Psychosocial Assessment  Patient Complaints None  Eye Contact Fair  Facial Expression Flat;Sad  Affect Appropriate to circumstance  Speech Logical/coherent  Interaction Guarded  Motor Activity Other (Comment) (WNL)  Appearance/Hygiene Unremarkable  Behavior Characteristics Appropriate to situation;Cooperative  Mood Pleasant  Thought Process  Coherency WDL  Content WDL  Delusions None reported or observed  Perception WDL  Hallucination None reported or observed  Judgment Impaired  Confusion None  Danger to Self  Current suicidal ideation? Denies  Agreement Not to Harm Self Yes  Description of Agreement verbal  Danger to Others  Danger to Others None reported or observed

## 2023-09-25 NOTE — Progress Notes (Signed)
   09/25/23 0529  15 Minute Checks  Location Bedroom  Visual Appearance Calm  Behavior Sleeping  Sleep (Behavioral Health Patients Only)  Calculate sleep? (Click Yes once per 24 hr at 0600 safety check) Yes  Documented sleep last 24 hours 7.75

## 2023-09-25 NOTE — Plan of Care (Signed)
  Problem: Education: Goal: Emotional status will improve Outcome: Progressing   Problem: Activity: Goal: Interest or engagement in activities will improve Outcome: Progressing   

## 2023-09-25 NOTE — Progress Notes (Signed)
[facility] MD Progress Note  09/25/2023 10:29 AM [name]  MRN:  [id number] Subjective:   Christopher Hardin is a 42 yr old male who presented on 11/27 to [facility] by PD due to worsening depression and SI, he was admitted to [facility] on 11/29.  PPHx is significant for Depression and EtOH Abuse and 2 Prior Suicide Attempt (last- OD 06/2023) and 1 Prior Psychiatric Hospitalization [facility] 06/2023), and no history of Self Injurious Behavior.    Case was discussed in the multidisciplinary team. MAR was reviewed and patient was compliant with medications.  He received no PRN medications yesterday.   Psychiatric Team made the following recommendations yesterday: -Continue Zoloft 100 mg daily for depression -Continue Agitation Protocol: Haldol/Ativan/Benadryl -Start CIWA -Start Ativan 1 mg q6 PRN CIWA>10 -Start Clonidine 0.1 mg TID PRN SBP > 180 or DBP > 100    On interview today patient reports he slept good last night.  He reports his appetite is doing good.  He reports no SI, HI, or AVH.  He reports no Paranoia or Ideas of Reference.  He reports no issues with his medications.  He reports that his mood is improving.  He reports that he is attending groups and getting along well with others on the unit.  Discussed with him that his blood pressure is better and he reports no dizziness or headache.  Discussed that we would draw a CMP this evening to monitor his potassium.  Discussed that we would ask Social Work to provide PCP resources and he was thankful.  He reports no other concerns at present.    Principal Problem: MDD (major depressive disorder), recurrent severe, without psychosis (HCC) Diagnosis: Principal Problem:   MDD (major depressive disorder), recurrent severe, without psychosis (HCC) Active Problems:   Alcohol use disorder  Total Time spent with patient:  I personally spent 35 minutes on the unit in direct patient care. The direct patient care time included face-to-face time  with the patient, reviewing the patient's chart, communicating with other professionals, and coordinating care. Greater than 50% of this time was spent in counseling or coordinating care with the patient regarding goals of hospitalization, psycho-education, and discharge planning needs.   Past Psychiatric History: Depression and EtOH Abuse and 2 Prior Suicide Attempt (last- OD 06/2023) and 1 Prior Psychiatric Hospitalization [facility] 06/2023), and no history of Self Injurious Behavior.   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   Family History:  Family History  Problem Relation Age of Onset   Bladder Cancer Neg Hx    Prostate cancer Neg Hx    Kidney cancer Neg Hx    Family Psychiatric  History:  Mother, Maternal Cousin, Maternal Aunt- EtOH Abuse No Known Diagnosis' or Suicides  Social History:  Social History   Substance and Sexual Activity  Alcohol Use Yes   Alcohol/week: 1.0 standard drink of alcohol   Types: 1 Cans of beer per week   Comment: "Some vodka shots on weekends."     Social History   Substance and Sexual Activity  Drug Use No    Social History   Socioeconomic History   Marital status: Single    Spouse name: Not on file   Number of children: Not on file   Years of education: Not on file   Highest education level:  Not on file  Occupational History   Not on file  Tobacco Use   Smoking status: Former   Smokeless tobacco: Never  Vaping Use   Vaping status: Every Day  Substance and Sexual Activity   Alcohol use: Yes    Alcohol/week: 1.0 standard drink of alcohol    Types: 1 Cans of beer per week    Comment: "Some vodka shots on weekends."   Drug use: No   Sexual activity: Yes  Other Topics Concern   Not on file  Social History Narrative   Not on file   Social Determinants of Health    Financial Resource Strain: Not on file  Food Insecurity: No Food Insecurity (09/23/2023)   Hunger Vital Sign    Worried About Running Out of Food in the Last Year: Never true    Ran Out of Food in the Last Year: Never true  Transportation Needs: Unmet Transportation Needs (09/23/2023)   PRAPARE - Administrator, Civil Service (Medical): Yes    Lack of Transportation (Non-Medical): No  Physical Activity: Not on file  Stress: Not on file  Social Connections: Not on file   Additional Social History:                         Sleep: Good  Appetite:  Good  Current Medications: Current Facility-Administered Medications  Medication Dose Route Frequency Provider Last Rate Last Admin   acetaminophen (TYLENOL) tablet 650 mg  650 mg Oral Q6H PRN [name], NP       alum & mag hydroxide-simeth (MAALOX/MYLANTA) 200-200-20 MG/5ML suspension 30 mL  30 mL Oral Q4H PRN [name], NP       amLODipine (NORVASC) tablet 5 mg  5 mg Oral Daily [name], NP   5 mg at 09/25/23 0757   chlorthalidone (HYGROTON) tablet 25 mg  25 mg Oral Daily [name], NP   25 mg at 09/25/23 0757   cloNIDine (CATAPRES) tablet 0.1 mg  0.1 mg Oral TID PRN [name], MD       haloperidol (HALDOL) tablet 5 mg  5 mg Oral TID PRN [name], NP       And   diphenhydrAMINE (BENADRYL) capsule 50 mg  50 mg Oral TID PRN [name], NP       haloperidol lactate (HALDOL) injection 5 mg  5 mg Intramuscular TID PRN [name], NP       And   diphenhydrAMINE (BENADRYL) injection 50 mg  50 mg Intramuscular TID PRN [name], NP       And   LORazepam (ATIVAN) injection 2 mg  2 mg Intramuscular TID PRN [name], NP       haloperidol lactate (HALDOL) injection 10 mg  10 mg Intramuscular TID PRN [name], NP       And   diphenhydrAMINE (BENADRYL) injection 50 mg  50 mg Intramuscular TID PRN [name], NP       And   LORazepam (ATIVAN)  injection 2 mg  2 mg Intramuscular TID PRN [name], NP       hydrOXYzine (ATARAX) tablet 25 mg  25 mg Oral TID PRN [name], NP       LORazepam (ATIVAN) tablet 1 mg  1 mg Oral Q6H PRN [name], MD       magnesium hydroxide (MILK OF MAGNESIA) suspension  30 mL  30 mL Oral Daily PRN [name], NP       melatonin tablet 3 mg  3 mg Oral QHS PRN [name], [name], NP   3 mg at 09/23/23 2224   multivitamin with minerals tablet 1 tablet  1 tablet Oral Daily [name], MD   1 tablet at 09/25/23 0757   sertraline (ZOLOFT) tablet 100 mg  100 mg Oral Daily [name], NP   100 mg at 09/25/23 0757   thiamine (Vitamin B-1) tablet 100 mg  100 mg Oral Daily [name], MD   100 mg at 09/25/23 0981    Lab Results: No results found for this or any previous visit (from the past 48 hour(s)).  Blood Alcohol level:  Lab Results  Component Value Date   ETH 294 (H) 09/22/2023   ETH <10 07/18/2023    Metabolic Disorder Labs: Lab Results  Component Value Date   HGBA1C 6.4 (H) 07/18/2023   MPG 136.98 07/18/2023   No results found for: "PROLACTIN" Lab Results  Component Value Date   CHOL 346 (H) 07/18/2023   TRIG 45 07/18/2023   HDL >135 07/18/2023   CHOLHDL NOT CALCULATED 07/18/2023   VLDL 9 07/18/2023   LDLCALC NOT CALCULATED 07/18/2023    Physical Findings: AIMS:  , ,  ,  ,    CIWA:  CIWA-Ar Total: 0 COWS:     Musculoskeletal: Strength & Muscle Tone: within normal limits Gait & Station: normal Patient leans: N/A  Psychiatric Specialty Exam:  Presentation  General Appearance:  Appropriate for Environment; Casual  Eye Contact: Good  Speech: Clear and Coherent; Normal Rate  Speech Volume: Normal  Handedness: Right   Mood and Affect  Mood: -- ("ok")  Affect: Appropriate; Congruent   Thought Process  Thought Processes: Coherent; Goal Directed  Descriptions of Associations:Intact  Orientation:Full (Time,  Place and Person)  Thought Content:Logical; WDL  History of Schizophrenia/Schizoaffective disorder:No  Duration of Psychotic Symptoms:No data recorded Hallucinations:Hallucinations: None  Ideas of Reference:None  Suicidal Thoughts:Suicidal Thoughts: No  Homicidal Thoughts:Homicidal Thoughts: No   Sensorium  Memory: Immediate Good; Recent Good  Judgment: Fair  Insight: Fair   Art therapist  Concentration: Good  Attention Span: Good  Recall: Good  Fund of Knowledge: Good  Language: Good   Psychomotor Activity  Psychomotor Activity: Psychomotor Activity: Normal   Assets  Assets: Communication Skills; Desire for Improvement; Physical Health; Resilience; Social Support; Housing   Sleep  Sleep: Sleep: Good Number of Hours of Sleep: 7.75    Physical Exam: Physical Exam Vitals and nursing note reviewed.  Constitutional:      General: He is not in acute distress.    Appearance: Normal appearance. He is normal weight. He is not ill-appearing or toxic-appearing.  HENT:     Head: Normocephalic and atraumatic.  Pulmonary:     Effort: Pulmonary effort is normal.  Musculoskeletal:        General: Normal range of motion.  Neurological:     General: No focal deficit present.     Mental Status: He is alert.    Review of Systems  Respiratory:  Negative for cough and shortness of breath.   Cardiovascular:  Negative for chest pain.  Gastrointestinal:  Negative for abdominal pain, constipation, diarrhea, nausea and vomiting.  Neurological:  Negative for dizziness, weakness and headaches.  Psychiatric/Behavioral:  Negative for depression, hallucinations and suicidal ideas. The patient is not nervous/anxious.    Blood pressure (!) 110/90, pulse  99, temperature 98.4 F (36.9 C), temperature source Oral, resp. rate 18, height 6' (1.829 m), weight 70 kg, SpO2 98%. Body mass index is 20.94 kg/m.   Treatment Plan Summary: Daily contact with patient  to assess and evaluate symptoms and progress in treatment and Medication management  Tyelor Geancarlo Bermudes is a 42 yr old male who presented on 11/27 to [facility] by PD due to worsening depression and SI, he was admitted to [facility] on 11/29.  PPHx is significant for Depression and EtOH Abuse and 2 Prior Suicide Attempt (last- OD 06/2023) and 1 Prior Psychiatric Hospitalization [facility] 06/2023), and no history of Self Injurious Behavior.    Jamin is tolerating his medications well and his blood pressure is improving.  He is interacting well on the unit and attending groups.  We will not make any changes to his medications at this time.  We will draw a CMP this evening to monitor his potassium.  Social Work will provide resources for a PCP.  We will continue to monitor.    MDD, Recurrent, Severe, w/out Psychosis: -Continue Zoloft 100 mg daily for depression -Continue Agitation Protocol: Haldol/Ativan/Benadryl     Withdrawal: -Continue CIWA, last score = 0  @  0535  11/30 -Continue Ativan 1 mg q6 PRN CIWA>10 -Continue Thiamine 100 mg daily for nutritional supplementation -Continue Multivitamin daily for nutritional supplementation       HTN: -Continue Amlodipine 5 mg daily -Continue Chlorthalidone 25 mg daily -Continue Clonidine 0.1 mg TID PRN SBP > 180 or DBP > 100     Hypokalemia: -K: 3.3 -Redraw CMP this evening      -Continue Melatonin 3 mg QHS for sleep -Continue PRN's: Tylenol, Maalox, Atarax, Milk of Magnesia, Trazodone   [name], MD 09/25/2023, 10:29 AM

## 2023-09-25 NOTE — Group Note (Signed)
LCSW Group Therapy Note  Group Date: 09/25/2023 Start Time: 1000 End Time: 1100   Type of Therapy and Topic:  Group Therapy - Healthy vs Unhealthy Coping Skills  Participation Level:  Active   Description of Group The focus of this group was to determine what unhealthy coping techniques typically are used by group members and what healthy coping techniques would be helpful in coping with various problems. Patients were guided in becoming aware of the differences between healthy and unhealthy coping techniques. Patients were asked to identify 2-3 healthy coping skills they would like to learn to use more effectively.  Therapeutic Goals Patients learned that coping is what human beings do all day long to deal with various situations in their lives Patients defined and discussed healthy vs unhealthy coping techniques Patients identified their preferred coping techniques and identified whether these were healthy or unhealthy Patients determined 2-3 healthy coping skills they would like to become more familiar with and use more often. Patients provided support and ideas to each other   Summary of Patient Progress:  During group, Christopher Hardin expressed that he keeps his feelings inside, which he recognizes as being unhealthy, but that he will use the healthy coping skills of cooking, cleaning, and listening to music.  He added that he typically drinks alcohol while doing these things, and would like to work on stopping that. Patient proved open to input from peers and feedback from CSW. Patient demonstrated good insight into the subject matter, was respectful of peers, and participated throughout the entire session.   Therapeutic Modalities Cognitive Behavioral Therapy Motivational Interviewing  Lynnell Chad, Theresia Majors 09/25/2023  1:08 PM

## 2023-09-26 DIAGNOSIS — F332 Major depressive disorder, recurrent severe without psychotic features: Secondary | ICD-10-CM

## 2023-09-26 MED ORDER — POTASSIUM CHLORIDE CRYS ER 20 MEQ PO TBCR
30.0000 meq | EXTENDED_RELEASE_TABLET | Freq: Two times a day (BID) | ORAL | Status: AC
  Administered 2023-09-26 (×2): 30 meq via ORAL
  Filled 2023-09-26 (×2): qty 1

## 2023-09-26 NOTE — Progress Notes (Signed)
[facility] MD Progress Note  09/26/2023 12:39 PM Christopher Hardin  MRN:  [id number] Subjective:   Christopher Hardin is a 42 yr old male who presented on 11/27 to [facility] by PD due to worsening depression and SI, he was admitted to [facility] on 11/29.  PPHx is significant for Depression and EtOH Abuse and 2 Prior Suicide Attempt (last- OD 06/2023) and 1 Prior Psychiatric Hospitalization [facility] 06/2023), and no history of Self Injurious Behavior.    Case was discussed in the multidisciplinary team. MAR was reviewed and patient was compliant with medications.  He received no PRN Melatonin last night.   Psychiatric Team made the following recommendations yesterday: -Continue Zoloft 100 mg daily for depression -Continue Agitation Protocol: Haldol/Ativan/Benadryl -Continue CIWA -Continue Ativan 1 mg q6 PRN CIWA>10 -Continue Clonidine 0.1 mg TID PRN SBP > 180 or DBP > 100    On interview today patient reports he slept good last night.  He reports his appetite is doing good.  He reports no SI, HI, or AVH.  He reports no Paranoia or Ideas of Reference.  He reports no issues with his medications.  He reports his mood is good today.  Discussed with him that his lab work showed his potassium is low and so we would supplement this morning and this evening and then recheck again in the morning.  Discussed with him that he would need to follow up with a PCP after discharge for this as his blood pressure medicine is most likely the cause of this and there needs to be consideration of either changing medication or supplementing with potassium long-term.  Discussed with him that social work would assist him with this and he reported understanding and is agreeable with this.  He reports no other concerns at present.   Principal Problem: MDD (major depressive disorder), recurrent severe, without psychosis (HCC) Diagnosis: Principal Problem:   MDD (major depressive disorder), recurrent severe, without psychosis  (HCC) Active Problems:   Alcohol use disorder  Total Time spent with patient:  I personally spent 35 minutes on the unit in direct patient care. The direct patient care time included face-to-face time with the patient, reviewing the patient's chart, communicating with other professionals, and coordinating care. Greater than 50% of this time was spent in counseling or coordinating care with the patient regarding goals of hospitalization, psycho-education, and discharge planning needs.   Past Psychiatric History: Depression and EtOH Abuse and 2 Prior Suicide Attempt (last- OD 06/2023) and 1 Prior Psychiatric Hospitalization [facility] 06/2023), and no history of Self Injurious Behavior.   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   Family History:  Family History  Problem Relation Age of Onset   Bladder Cancer Neg Hx    Prostate cancer Neg Hx    Kidney cancer Neg Hx    Family Psychiatric  History:  Mother, Maternal Cousin, Maternal Aunt- EtOH Abuse No Known Diagnosis' or Suicides  Social History:  Social History   Substance and Sexual Activity  Alcohol Use Yes   Alcohol/week: 1.0 standard drink of alcohol   Types: 1 Cans of beer per week   Comment: "Some vodka shots on weekends."     Social History   Substance and Sexual Activity  Drug Use No    Social History   Socioeconomic History  Marital status: Single    Spouse name: Not on file   Number of children: Not on file   Years of education: Not on file   Highest education level: Not on file  Occupational History   Not on file  Tobacco Use   Smoking status: Former   Smokeless tobacco: Never  Vaping Use   Vaping status: Every Day  Substance and Sexual Activity   Alcohol use: Yes    Alcohol/week: 1.0 standard drink of alcohol    Types: 1 Cans of beer per week     Comment: "Some vodka shots on weekends."   Drug use: No   Sexual activity: Yes  Other Topics Concern   Not on file  Social History Narrative   Not on file   Social Determinants of Health   Financial Resource Strain: Not on file  Food Insecurity: No Food Insecurity (09/23/2023)   Hunger Vital Sign    Worried About Running Out of Food in the Last Year: Never true    Ran Out of Food in the Last Year: Never true  Transportation Needs: Unmet Transportation Needs (09/23/2023)   PRAPARE - Administrator, Civil Service (Medical): Yes    Lack of Transportation (Non-Medical): No  Physical Activity: Not on file  Stress: Not on file  Social Connections: Not on file   Additional Social History:                         Sleep: Good  Appetite:  Good  Current Medications: Current Facility-Administered Medications  Medication Dose Route Frequency Provider Last Rate Last Admin   acetaminophen (TYLENOL) tablet 650 mg  650 mg Oral Q6H PRN [name], NP       alum & mag hydroxide-simeth (MAALOX/MYLANTA) 200-200-20 MG/5ML suspension 30 mL  30 mL Oral Q4H PRN [name], NP       amLODipine (NORVASC) tablet 5 mg  5 mg Oral Daily [name], NP   5 mg at 09/26/23 0865   chlorthalidone (HYGROTON) tablet 25 mg  25 mg Oral Daily [name], NP   25 mg at 09/26/23 7846   cloNIDine (CATAPRES) tablet 0.1 mg  0.1 mg Oral TID PRN [name], MD       haloperidol (HALDOL) tablet 5 mg  5 mg Oral TID PRN [name], NP       And   diphenhydrAMINE (BENADRYL) capsule 50 mg  50 mg Oral TID PRN [name], NP       haloperidol lactate (HALDOL) injection 5 mg  5 mg Intramuscular TID PRN [name], NP       And   diphenhydrAMINE (BENADRYL) injection 50 mg  50 mg Intramuscular TID PRN [name], NP       And   LORazepam (ATIVAN) injection 2 mg  2 mg Intramuscular TID PRN [name], NP       haloperidol lactate (HALDOL) injection  10 mg  10 mg Intramuscular TID PRN [name], NP       And   diphenhydrAMINE (BENADRYL) injection 50 mg  50 mg Intramuscular TID PRN [name], NP       And   LORazepam (ATIVAN) injection 2 mg  2 mg Intramuscular TID PRN [name], NP       hydrOXYzine (ATARAX) tablet 25 mg  25 mg Oral TID PRN [name], NP  LORazepam (ATIVAN) tablet 1 mg  1 mg Oral Q6H PRN Renaldo Fiddler, [name], MD       magnesium hydroxide (MILK OF MAGNESIA) suspension 30 mL  30 mL Oral Daily PRN [name], NP       melatonin tablet 3 mg  3 mg Oral QHS PRN [name], [name], NP   3 mg at 09/25/23 2110   multivitamin with minerals tablet 1 tablet  1 tablet Oral Daily [name], MD   1 tablet at 09/26/23 4098   potassium chloride (KLOR-CON M) CR tablet 30 mEq  30 mEq Oral BID [name], MD   30 mEq at 09/26/23 1191   sertraline (ZOLOFT) tablet 100 mg  100 mg Oral Daily [name], NP   100 mg at 09/26/23 4782   thiamine (Vitamin B-1) tablet 100 mg  100 mg Oral Daily [name], MD   100 mg at 09/26/23 9562    Lab Results:  Results for orders placed or performed during the hospital encounter of 09/23/23 (from the past 48 hour(s))  Comprehensive metabolic panel     Status: Abnormal   Collection Time: 09/25/23  6:11 PM  Result Value Ref Range   Sodium 134 (L) 135 - 145 mmol/L   Potassium 3.1 (L) 3.5 - 5.1 mmol/L   Chloride 97 (L) 98 - 111 mmol/L   CO2 27 22 - 32 mmol/L   Glucose, Bld 129 (H) 70 - 99 mg/dL    Comment: Glucose reference range applies only to samples taken after fasting for at least 8 hours.   BUN 16 6 - 20 mg/dL   Creatinine, Ser 1.30 0.61 - 1.24 mg/dL   Calcium 86.5 8.9 - 78.4 mg/dL   Total Protein 8.6 (H) 6.5 - 8.1 g/dL   Albumin 4.7 3.5 - 5.0 g/dL   AST 53 (H) 15 - 41 U/L   ALT 36 0 - 44 U/L   Alkaline Phosphatase 42 38 - 126 U/L   Total Bilirubin 0.7 <1.2 mg/dL   GFR, Estimated >69 >62 mL/min    Comment: (NOTE) Calculated  using the CKD-EPI Creatinine Equation (2021)    Anion gap 10 5 - 15    Comment: Performed at [facility], [address], [place], [place] [address]    Blood Alcohol level:  Lab Results  Component Value Date   ETH 294 (H) 09/22/2023   ETH <10 07/18/2023    Metabolic Disorder Labs: Lab Results  Component Value Date   HGBA1C 6.4 (H) 07/18/2023   MPG 136.98 07/18/2023   No results found for: "PROLACTIN" Lab Results  Component Value Date   CHOL 346 (H) 07/18/2023   TRIG 45 07/18/2023   HDL >135 07/18/2023   CHOLHDL NOT CALCULATED 07/18/2023   VLDL 9 07/18/2023   LDLCALC NOT CALCULATED 07/18/2023    Physical Findings: AIMS:  , ,  ,  ,    CIWA:  CIWA-Ar Total: 0 COWS:     Musculoskeletal: Strength & Muscle Tone: within normal limits Gait & Station: normal Patient leans: N/A  Psychiatric Specialty Exam:  Presentation  General Appearance:  Appropriate for Environment; Casual  Eye Contact: Good  Speech: Clear and Coherent; Normal Rate  Speech Volume: Normal  Handedness: Right   Mood and Affect  Mood: Euthymic  Affect: Appropriate; Congruent   Thought Process  Thought Processes: Coherent; Goal Directed  Descriptions of Associations:Intact  Orientation:Full (Time, Place and Person)  Thought Content:Logical; WDL  History of  Schizophrenia/Schizoaffective disorder:No  Duration of Psychotic Symptoms:No data recorded Hallucinations:Hallucinations: None  Ideas of Reference:None  Suicidal Thoughts:Suicidal Thoughts: No  Homicidal Thoughts:Homicidal Thoughts: No   Sensorium  Memory: Immediate Good; Recent Good  Judgment: Fair  Insight: Fair   Art therapist  Concentration: Good  Attention Span: Good  Recall: Good  Fund of Knowledge: Good  Language: Good   Psychomotor Activity  Psychomotor Activity: Psychomotor Activity: Normal   Assets  Assets: Communication Skills; Desire for  Improvement; Housing; Physical Health; Resilience   Sleep  Sleep: Sleep: Good Number of Hours of Sleep: 6.5    Physical Exam: Physical Exam Vitals and nursing note reviewed.  Constitutional:      General: He is not in acute distress.    Appearance: Normal appearance. He is normal weight. He is not ill-appearing or toxic-appearing.  HENT:     Head: Normocephalic and atraumatic.  Pulmonary:     Effort: Pulmonary effort is normal.  Musculoskeletal:        General: Normal range of motion.  Neurological:     General: No focal deficit present.     Mental Status: He is alert.    Review of Systems  Respiratory:  Negative for cough and shortness of breath.   Cardiovascular:  Negative for chest pain.  Gastrointestinal:  Negative for abdominal pain, constipation, diarrhea, nausea and vomiting.  Neurological:  Negative for dizziness, weakness and headaches.  Psychiatric/Behavioral:  Negative for depression, hallucinations and suicidal ideas. The patient is not nervous/anxious.    Blood pressure 114/85, pulse 89, temperature 98.3 F (36.8 C), temperature source Oral, resp. rate 20, height 6' (1.829 m), weight 70 kg, SpO2 100%. Body mass index is 20.94 kg/m.   Treatment Plan Summary: Daily contact with patient to assess and evaluate symptoms and progress in treatment and Medication management  Christopher Hardin is a 42 yr old male who presented on 11/27 to [facility] by PD due to worsening depression and SI, he was admitted to [facility] on 11/29.  PPHx is significant for Depression and EtOH Abuse and 2 Prior Suicide Attempt (last- OD 06/2023) and 1 Prior Psychiatric Hospitalization [facility] 06/2023), and no history of Self Injurious Behavior.    Christopher Hardin has continued to be well from a psychiatric standpoint with the restart of his Zoloft.  He does continue to have low potassium so we will replete today and will recheck tomorrow morning.  Social work will work on getting him an appointment with a PCP  tomorrow to follow up with his blood pressure and low potassium.  We will not make any other changes in medications at this time.  We will continue to monitor.   MDD, Recurrent, Severe, w/out Psychosis: -Continue Zoloft 100 mg daily for depression -Continue Agitation Protocol: Haldol/Ativan/Benadryl     Withdrawal: -Continue CIWA, last score = 0  @  2354  11/30 -Continue Ativan 1 mg q6 PRN CIWA>10 -Continue Thiamine 100 mg daily for nutritional supplementation -Continue Multivitamin daily for nutritional supplementation       HTN: -Continue Amlodipine 5 mg daily -Continue Chlorthalidone 25 mg daily -Continue Clonidine 0.1 mg TID PRN SBP > 180 or DBP > 100     Hypokalemia: -redraw K: 3.1 -Start Klor-Con 30 mEq BID for 2 doses -Redraw CMP this evening      -Continue Melatonin 3 mg QHS for sleep -Continue PRN's: Tylenol, Maalox, Atarax, Milk of Magnesia, Trazodone   [name], MD 09/26/2023, 12:39 PM

## 2023-09-26 NOTE — Progress Notes (Signed)
   09/26/23 0529  15 Minute Checks  Location Bedroom  Visual Appearance Calm  Behavior Sleeping  Sleep (Behavioral Health Patients Only)  Calculate sleep? (Click Yes once per 24 hr at 0600 safety check) Yes  Documented sleep last 24 hours 6.5

## 2023-09-26 NOTE — Progress Notes (Signed)
   09/26/23 2134  Psych Admission Type (Psych Patients Only)  Admission Status Involuntary  Psychosocial Assessment  Patient Complaints None  Eye Contact Fair  Facial Expression Animated  Affect Appropriate to circumstance  Speech Logical/coherent  Interaction Assertive  Motor Activity Other (Comment) (WDL)  Appearance/Hygiene Unremarkable  Behavior Characteristics Appropriate to situation  Mood Pleasant  Thought Process  Coherency WDL  Content WDL  Delusions None reported or observed  Perception WDL  Hallucination None reported or observed  Judgment Impaired  Confusion None  Danger to Self  Current suicidal ideation? Denies  Agreement Not to Harm Self Yes  Description of Agreement verbal  Danger to Others  Danger to Others None reported or observed

## 2023-09-26 NOTE — [facility] Group Notes (Signed)
[facility] Group Notes:  (Nursing/MHT/Case Management/Adjunct)  Date:  09/26/2023  Time:  10:08 PM  Type of Therapy:  Psychoeducational Skills  Participation Level:  Did Not Attend  Participation Quality:  Resistant  Affect:  Resistant  Cognitive:  Lacking  Insight:  None  Engagement in Group:  None  Modes of Intervention:  Education  Summary of Progress/Problems:  The patient did not attend group this evening.   Shanterria Franta S 09/26/2023, 10:08 PM

## 2023-09-26 NOTE — Plan of Care (Signed)
  Problem: Education: Goal: Knowledge of [facility] Education information/materials will improve Outcome: Progressing   Problem: Education: Goal: Emotional status will improve Outcome: Progressing   

## 2023-09-26 NOTE — [facility] Group Notes (Signed)
[facility] Group Notes:  (Nursing/MHT/Case Management/Adjunct)  Date:  09/26/2023  Time:  9:26 AM  Type of Therapy:   Goals  Participation Level:  Active  Participation Quality:  Appropriate  Affect:  Appropriate  Cognitive:  Appropriate  Insight:  Good  Engagement in Group:  Engaged  Modes of Intervention:  Discussion and Orientation  Summary of Progress/Problems: Goal is to discharge  Christopher Hardin 09/26/2023, 9:26 AM

## 2023-09-26 NOTE — [facility] Group Notes (Signed)
[facility] Group Notes:  (Nursing/MHT/Case Management/Adjunct)  Date:  09/26/2023  Time:  12:58 PM  Type of Therapy:  Psychoeducational Skills  Participation Level:  Active  Participation Quality:  Appropriate  Affect:  Appropriate  Cognitive:  Alert and Appropriate  Insight:  Appropriate  Engagement in Group:  Engaged  Modes of Intervention:  Discussion and Education  Summary of Progress/Problems:  Patients were given education on motivation with a podcast from '' [employer] '' in which the guest was DR. ALOK K who talked about dopamine, and tapping into the power you have to get where you want to go in life. ''  Pt attended and was appropriate.   Christopher Hardin 09/26/2023, 12:58 PM

## 2023-09-26 NOTE — Progress Notes (Signed)
   09/26/23 0900  Psych Admission Type (Psych Patients Only)  Admission Status Involuntary  Psychosocial Assessment  Patient Complaints None  Eye Contact Fair  Facial Expression Sad  Affect Appropriate to circumstance  Speech Logical/coherent  Interaction Guarded  Motor Activity Slow  Appearance/Hygiene Unremarkable  Behavior Characteristics Appropriate to situation;Cooperative  Mood Pleasant  Thought Process  Coherency WDL  Content WDL  Delusions None reported or observed  Perception WDL  Hallucination None reported or observed  Judgment Impaired  Confusion None  Danger to Self  Current suicidal ideation? Denies  Agreement Not to Harm Self Yes  Description of Agreement verbal  Danger to Others  Danger to Others None reported or observed

## 2023-09-27 DIAGNOSIS — F332 Major depressive disorder, recurrent severe without psychotic features: Secondary | ICD-10-CM | POA: Diagnosis not present

## 2023-09-27 MED ORDER — MELATONIN 3 MG PO TABS: 3.0000 mg | ORAL_TABLET | Freq: Every evening | ORAL | 0 refills | Status: AC | PRN

## 2023-09-27 MED ORDER — SERTRALINE HCL 100 MG PO TABS: 100.0000 mg | ORAL_TABLET | Freq: Every day | ORAL | 0 refills | Status: AC

## 2023-09-27 MED ORDER — STRESS FORMULA/ZINC PO TABS: 1.0000 | ORAL_TABLET | Freq: Every day | ORAL | 0 refills | Status: AC

## 2023-09-27 NOTE — Plan of Care (Signed)
  Problem: Education: Goal: Knowledge of [facility] Education information/materials will improve Outcome: Completed/Met Goal: Emotional status will improve Outcome: Completed/Met Goal: Mental status will improve Outcome: Completed/Met Goal: Verbalization of understanding the information provided will improve Outcome: Completed/Met   Problem: Activity: Goal: Interest or engagement in activities will improve Outcome: Completed/Met Goal: Sleeping patterns will improve Outcome: Completed/Met   Problem: Coping: Goal: Ability to verbalize frustrations and anger appropriately will improve Outcome: Completed/Met Goal: Ability to demonstrate self-control will improve Outcome: Completed/Met

## 2023-09-27 NOTE — Progress Notes (Signed)
  [facility] Adult Case Management Discharge Plan :  Will you be returning to the same living situation after discharge:  Yes,  pt will be returning home with fiance' Bobetta Lime [phone] At discharge, do you have transportation home?: Yes,  pt will be transported by Bobetta Lime [phone] Do you have the ability to pay for your medications: Yes,  pt has active medical coverage  Release of information consent forms completed and in the chart;  Patient's signature needed at discharge.  Patient to Follow up at:  Follow-up Information     [facility] Schedule an appointment as soon as possible for a visit.   Why: You have an appt on 10/18/2023 at 2:30 pm. This is an in person visit. Contact information: [address] [place] [address] [phone]         [facility]. Schedule an appointment as soon as possible for a visit.   Specialty: Psychology Why: Please call this provider to schedule an appointment for therapy services, as they were closed for the holiday. Contact information: [address] [place] [address] [place] [address] [phone]         [facility]. Schedule an appointment as soon as possible for a visit.   Why: Please call this provider and ask to schedule an appointment for primary care services. Contact information: [address] [place] [address]                Next level of care provider has access to [facility] Link:yes  Safety Planning and Suicide Prevention discussed: Yes,  Bobetta Lime (fiance) [phone]     Has patient been referred to the Quitline?: Patient does not use tobacco/nicotine products  Patient has been referred for addiction treatment: No known substance use disorder.  Enrica Corliss, Candace Cruise, LCSWA 09/27/2023, 9:20 AM

## 2023-09-27 NOTE — Discharge Summary (Signed)
Physician Discharge Summary Note  Patient:  Christopher Hardin is an 42 y.o., male MRN:  [id number] DOB:  1981/06/16 Patient phone:  [phone] (home)  Patient address:   [address] [place] [address],  Total Time spent with patient: 15 minutes  Date of Admission:  09/23/2023 Date of Discharge: 09/27/2023  Reason for Admission:   Per H&P 09/24/23: History of Present Illness:  Christopher Hardin is a 42 yr old male who presented on 11/27 to [facility] by PD due to worsening depression and SI, he was admitted to [facility] on 11/29.  PPHx is significant for Depression and EtOH Abuse and 2 Prior Suicide Attempt (last OD 06/2023) and 1 Prior Psychiatric Hospitalization [facility] 06/2023), and no history of Self Injurious Behavior.   When asked what led to his hospitalization he reports that it was due to his anger.  He reports that unlike when he was previously hospitalized overall he had not been depressed or having anxiety.  He reports that he bought a car last week and it broke down so after spending a significant amount of money he could now also not get around.  He reports that due to this he was ranting to his fiance.  He reports that his fiance was on the phone with her sister and her sister heard him and called the police.  He reports that he was under significant amount of stress and he had not been on his Zoloft for about 1 week due to running out.  He reports that prior to running out of the medication he had been doing significantly better.  He reports he wants to get back on his medication and get better.   He reports a past psychiatric history significant for depression and alcohol abuse.  He reports 2 prior suicide attempts-last via OD 06/2023.  He reports no history of self-injurious behavior.  He reports 1 prior psychiatric hospitalization- [facility] 06/2023.  He reports past medical history significant for hypertension.  He reports past surgical history significant for arm surgery after  punching a glass screen door.  He reports no history of Hardin trauma.  He reports no history of seizures.  He reports NKDA.   He reports he currently lives with his fiancee and kids.  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  He reports completing the 11th grade.  He reports drinking less EtOH as he no longer drinks any liquor and only drinks 1 40 ounce beer after work.  He reports that he vapes.  He reports no illicit substance use.  He reports no legal issues.  He reports no access to firearms.   He reports that he is wanting to continue with the Zoloft as it was doing well for him.  Discussed with him that given the acute stressors along with him not being on his medication and alcohol in his system we would like to confirm what happened with his fiance and he was agreeable with this.  Discussed with him that after this was done we would drop the IVC and he would sign voluntarily in for treatment and he was agreeable with this.  He reports no SI, HI, or AVH.  He reports no other concerns present.     Called patient's Christopher Hardin, [phone].  She confirms that patient was under a significant acute stressor due to his newly purchased car being "essentially 11."  She confirms that he had been out of his Zoloft for about a week.  She reports that prior to this he had been doing significantly better since he was discharged from the hospital.  She reports that if he is restarted on his medications she thinks he would not have any safety concerns.  Discussed with her that we would let the patient sign voluntarily into the hospital and continue with his Zoloft.  She reports no other concerns at present.  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   Family History:  Family History  Problem Relation Age of Onset   Bladder Cancer Neg Hx    Prostate cancer Neg Hx    Kidney cancer Neg Hx    Family Psychiatric  History: Mother, Maternal Cousin, Maternal Aunt- EtOH Abuse No Known Diagnosis' or Suicides Social History:  Social History   Substance and Sexual Activity  Alcohol Use Yes   Alcohol/week: 1.0 standard drink of alcohol   Types: 1 Cans of beer per week   Comment: "Some vodka shots on weekends."     Social History   Substance and Sexual Activity  Drug Use No    Social History   Socioeconomic History   Marital status: Single    Spouse name: Not on file   Number of children: Not on file   Years of education: Not on file   Highest education level: Not on file  Occupational History   Not on file  Tobacco Use   Smoking status: Former   Smokeless tobacco: Never  Vaping Use   Vaping status: Every Day  Substance and Sexual Activity   Alcohol use: Yes    Alcohol/week: 1.0 standard drink of alcohol    Types: 1 Cans of beer per week    Comment: "Some vodka shots on weekends."   Drug use: No   Sexual activity: Yes  Other Topics Concern   Not on file  Social History Narrative   Not on file   Social Determinants of Health   Financial Resource Strain: Not on file  Food Insecurity: No Food Insecurity (09/23/2023)   Hunger Vital Sign    Worried About Running Out of Food in the Last Year: Never true    Ran Out of Food in the Last Year: Never true  Transportation Needs: Unmet Transportation Needs (09/23/2023)   PRAPARE - Administrator, Civil Service (Medical): Yes    Lack of Transportation (Non-Medical): No  Physical Activity: Not on file  Stress: Not on file  Social  Connections: Not on file    Hospital Course:  During the course of patient's hospitalization, the 15-minute checks were adequate to ensure patient's safety. Patient did not exhibit erratic or aggressive behavior and was compliant with scheduled medication. Patient was recommended for outpatient psychiatry.  At the time of discharge patient is not reporting any acute suicidal/homicidal ideations/AVH, delusional thoughts or paranoia. Patient did not appear to be responding to any internal stimuli. Patient feels more confident about self-care & in managing their mental health problems. Patient currently denies any new issues or concerns. Education and supportive counseling provided throughout patient's hospital stay & upon  discharge.   Today upon discharge evaluation, the patient gives a mood of "good". Patient denies any specific concerns. Patient slept well, appetite good, regular bowel movements. Patient denies any new physical complaints. Patient feels that the medications have been helpful & is in agreement to continue current treatment regimen as recommended. Patient was able to engage in safety planning including plan to return to [facility] or contact emergency services if patient feels unable to maintain their own safety or the safety of others. Patient had no further questions, comments, or concerns. Patient left [facility] with all personal belongings in no apparent distress. Transportation per safe transport to home was arranged for patient.  Physical Findings: AIMS:  , ,  ,  ,    CIWA:  CIWA-Ar Total: 1 COWS:     Musculoskeletal: Strength & Muscle Tone: within normal limits Gait & Station: normal Patient leans: N/A   Psychiatric Specialty Exam:  Presentation  General Appearance:  Appropriate for Environment; Casual  Eye Contact: Good  Speech: Normal Rate  Speech Volume: Normal  Handedness: Right   Mood and Affect  Mood: Euthymic  Affect: Full Range   Thought Process  Thought  Processes: Linear  Descriptions of Associations:Intact  Orientation:Full (Time, Place and Person)  Thought Content:Logical  History of Schizophrenia/Schizoaffective disorder:No  Duration of Psychotic Symptoms:No data recorded Hallucinations:Hallucinations: None  Ideas of Reference:None  Suicidal Thoughts:Suicidal Thoughts: No  Homicidal Thoughts:Homicidal Thoughts: No   Sensorium  Memory: Immediate Good; Recent Fair  Judgment: Fair  Insight: Fair   Art therapist  Concentration: Good  Attention Span: Good  Recall: Good  Fund of Knowledge: Good  Language: Good   Psychomotor Activity  Psychomotor Activity: Psychomotor Activity: Normal   Assets  Assets: Communication Skills; Social Support; Housing   Sleep  Sleep: Sleep: Fair Number of Hours of Sleep: 6.5    Physical Exam: Physical Exam Vitals and nursing note reviewed.  Constitutional:      Appearance: Normal appearance.  HENT:     Hardin: Normocephalic and atraumatic.  Eyes:     Extraocular Movements: Extraocular movements intact.  Pulmonary:     Effort: Pulmonary effort is normal.  Musculoskeletal:        General: Normal range of motion.     Cervical back: Normal range of motion.  Neurological:     General: No focal deficit present.     Mental Status: He is alert and oriented to person, place, and time.  Psychiatric:        Mood and Affect: Mood normal.        Behavior: Behavior normal.    Review of Systems  Constitutional:  Negative for chills and fever.  Respiratory:  Negative for cough and shortness of breath.   Cardiovascular:  Negative for chest pain.  Gastrointestinal:  Negative for abdominal pain, constipation, diarrhea, nausea and vomiting.  Genitourinary:  Negative for dysuria.  Musculoskeletal:  Negative for myalgias.  Skin:  Negative for rash.  Neurological:  Negative for tremors and headaches.  Psychiatric/Behavioral:  Negative for hallucinations and  suicidal ideas.    Blood pressure 113/85, pulse 94, temperature 97.7 F (36.5 C), temperature source Oral, resp. rate 20, height 6' (1.829 m), weight 70 kg, SpO2 100%. Body mass index is 20.94 kg/m.   Social History   Tobacco Use  Smoking Status Former  Smokeless Tobacco Never   Tobacco Cessation:  A prescription for an FDA-approved tobacco cessation medication was offered at discharge and the patient refused   Blood Alcohol level:  Lab Results  Component Value Date   ETH 294 (H) 09/22/2023   ETH <10 07/18/2023    Metabolic Disorder Labs:  Lab Results  Component Value Date   HGBA1C 6.4 (H) 07/18/2023   MPG 136.98 07/18/2023   No results found for: "PROLACTIN" Lab Results  Component Value Date   CHOL 346 (H) 07/18/2023   TRIG 45 07/18/2023   HDL >135 07/18/2023   CHOLHDL NOT CALCULATED 07/18/2023   VLDL 9 07/18/2023   LDLCALC NOT CALCULATED 07/18/2023    See Psychiatric Specialty Exam and Suicide Risk Assessment completed by Attending Physician prior to discharge.  Discharge destination:  Home  Is patient on multiple antipsychotic therapies at discharge:  No     Discharge Instructions     Diet general   Complete by: As directed    Increase activity slowly   Complete by: As directed       Allergies as of 09/27/2023   No Known Allergies      Medication List     STOP taking these medications    traZODone 50 MG tablet Commonly known as: DESYREL       TAKE these medications      Indication  amLODipine 5 MG tablet Commonly known as: NORVASC Take 1 tablet (5 mg total) by mouth daily for 14 days.  Indication: High Blood Pressure   b complex-C-E-zinc tablet Take 1 tablet by mouth daily.  Indication: dietary support   chlorthalidone 25 MG tablet Commonly known as: HYGROTON Take 25 mg by mouth daily.  Indication: High Blood Pressure   melatonin 3 MG Tabs tablet Take 1 tablet (3 mg total) by mouth at bedtime as needed.  Indication: Trouble  Sleeping   sertraline 100 MG tablet Commonly known as: ZOLOFT Take 1 tablet (100 mg total) by mouth daily.  Indication: Major Depressive Disorder        Follow-up Information     [facility] Schedule an appointment as soon as possible for a visit.   Why: You have an appt on 10/18/2023 at 2:30 pm. This is an in person visit. Contact information: [address] [place] [address] [phone]         [facility] Schedule an appointment as soon as possible for a visit.   Specialty: Psychology Why: Please call this provider to schedule an appointment for therapy services, as they were closed for the holiday. Contact information: [address] [place] [address] [phone]         [facility]. Schedule an appointment as soon as possible for a visit.   Why: Please call this provider and ask to schedule an appointment for primary care services. Contact information: [address] [place] [address]                Follow-up recommendations:  Activity:  as tolerated Diet:  regular   Prescriptions for new medications provided for the patient to bridge to follow up appointment. The patient was informed that refills for these prescriptions are generally not provided, and patient is encouraged to attend all follow up appointments to address medication refills and adjustments.    Today's discharge was reviewed with treatment team, and the team is in agreement that the patient is ready for discharge. The patient is was of the discharge plan for today and has been given opportunity to ask questions. At time of discharge, the patient does not vocalize any acute harm  to self or others, is goal directed, able to advocate for self and organizational baseline.    At discharge, the patient is instructed to:  Take all medications as prescribed. Report any adverse effects  and or reactions from the medicines to her outpatient provider promptly.  Do not engage in alcohol and/or illegal drug use while on prescription medicines.  In the event of worsening symptoms, patient is instructed to call the crisis hotline, 911 and or go to the nearest ED for appropriate evaluation and treatment of symptoms.  Follow-up with primary care provider for further care of medical issues, concerns and or health care needs. * Substance abuse follow up: it is recommended that you follow up with community support treatment, like AA/NA. It is also recommended that the patient attend 90 meetings in 90 days, otherwise known as "90 in 90" * electrolyte abnormalities: follow up with primary care for further evaluation of your sodium and potassium . Eat a balanced diet and maintain hydration. If you experience cramping, spasms, altered mental status, heart palpitations, please seek emergent care  Signed: Roselle Locus, MD 09/27/2023, 9:24 AM

## 2023-09-27 NOTE — [facility] Suicide Risk Assessment (Signed)
[facility] Discharge Suicide Risk Assessment   Principal Problem: MDD (major depressive disorder), recurrent severe, without psychosis (HCC) Discharge Diagnoses: Principal Problem:   MDD (major depressive disorder), recurrent severe, without psychosis (HCC) Active Problems:   Alcohol use disorder   Total Time spent with patient: 15 minutes  Musculoskeletal: Strength & Muscle Tone: within normal limits Gait & Station: normal Patient leans: N/A  Psychiatric Specialty Exam  Presentation  General Appearance:  Appropriate for Environment; Casual  Eye Contact: Good  Speech: Clear and Coherent; Normal Rate  Speech Volume: Normal  Handedness: Right   Mood and Affect  Mood: Euthymic  Duration of Depression Symptoms: Greater than two weeks  Affect: Appropriate; Congruent   Thought Process  Thought Processes: Coherent; Goal Directed  Descriptions of Associations:Intact  Orientation:Full (Time, Place and Person)  Thought Content:Logical; WDL  History of Schizophrenia/Schizoaffective disorder:No  Duration of Psychotic Symptoms:No data recorded Hallucinations:Hallucinations: None  Ideas of Reference:None  Suicidal Thoughts:Suicidal Thoughts: No  Homicidal Thoughts:Homicidal Thoughts: No   Sensorium  Memory: Immediate Good; Recent Good  Judgment: Fair  Insight: Fair   Art therapist  Concentration: Good  Attention Span: Good  Recall: Good  Fund of Knowledge: Good  Language: Good   Psychomotor Activity  Psychomotor Activity: Psychomotor Activity: Normal   Assets  Assets: Communication Skills; Desire for Improvement; Housing; Physical Health; Resilience   Sleep  Sleep: Sleep: Good Number of Hours of Sleep: 6.5   Physical Exam: Physical Exam Vitals and nursing note reviewed.  Constitutional:      Appearance: Normal appearance.  HENT:     Head: Normocephalic and atraumatic.  Eyes:     Extraocular Movements: Extraocular  movements intact.  Pulmonary:     Effort: Pulmonary effort is normal.  Musculoskeletal:        General: Normal range of motion.     Cervical back: Normal range of motion.  Neurological:     General: No focal deficit present.     Mental Status: He is alert and oriented to person, place, and time.  Psychiatric:        Mood and Affect: Mood normal.        Behavior: Behavior normal.    Review of Systems  Constitutional:  Negative for chills and fever.  Respiratory:  Negative for cough and shortness of breath.   Cardiovascular:  Negative for chest pain.  Gastrointestinal:  Negative for constipation, diarrhea, nausea and vomiting.  Genitourinary:  Negative for dysuria.  Musculoskeletal:  Negative for myalgias.  Skin:  Negative for rash.  Neurological:  Negative for tremors and headaches.  Psychiatric/Behavioral:  Negative for hallucinations and suicidal ideas.    Blood pressure 113/85, pulse 94, temperature 97.7 F (36.5 C), temperature source Oral, resp. rate 20, height 6' (1.829 m), weight 70 kg, SpO2 100%. Body mass index is 20.94 kg/m.  Mental Status Per Nursing Assessment::   On Admission:  Suicidal ideation indicated by others, Self-harm thoughts, Self-harm behaviors  Demographic Factors:  Male  Loss Factors: NA  Historical Factors: Prior suicide attempts and Impulsivity  Risk Reduction Factors:   Responsible for children under 80 years of age, Sense of responsibility to family, Living with another person, especially a relative, Positive social support, and Positive therapeutic relationship  Continued Clinical Symptoms:  Depression:   Comorbid alcohol abuse/dependence  Cognitive Features That Contribute To Risk:  None    Suicide Risk:  Minimal: No identifiable suicidal ideation.  Patients presenting with no risk factors but with morbid ruminations; may be classified as  minimal risk based on the severity of the depressive symptoms   Follow-up Information     [facility] Schedule an appointment as soon as possible for a visit.   Why: Please call this provider to schedule an appointment for medication management services, as they were closed for the holiday.  They have Virtual and in person appts. Contact information: [address] [place] [address] [phone]         [facility]. Schedule an appointment as soon as possible for a visit.   Specialty: Psychology Why: Please call this provider to schedule an appointment for therapy services, as they were closed for the holiday. Contact information: [address] [place] [place] [address] [phone]         [facility]. Schedule an appointment as soon as possible for a visit.   Why: Please call this provider and ask to schedule an appointment for primary care services. Contact information: [address] [place] [address]                Plan Of Care/Follow-up recommendations:  Activity:  as tolerated Diet:  regular  Prescriptions for new medications provided for the patient to bridge to follow up appointment. The patient was informed that refills for these prescriptions are generally not provided, and patient is encouraged to attend all follow up appointments to address medication refills and adjustments.   Today's discharge was reviewed with treatment team, and the team is in agreement that the patient is ready for discharge. The patient is was of the discharge plan for today and has been given opportunity to ask questions. At time of discharge, the patient does not vocalize any acute harm to self or others, is goal directed, able to advocate for self and organizational baseline.   At discharge, the patient is instructed to:  Take all medications as prescribed. Report any adverse effects and or reactions from the medicines to her outpatient provider promptly.  Do not engage in  alcohol and/or illegal drug use while on prescription medicines.  In the event of worsening symptoms, patient is instructed to call the crisis hotline, 911 and or go to the nearest ED for appropriate evaluation and treatment of symptoms.  Follow-up with primary care provider for further care of medical issues, concerns and or health care needs. * Substance abuse follow up: it is recommended that you follow up with community support treatment, like AA/NA. It is also recommended that the patient attend 90 meetings in 90 days, otherwise known as "90 in 90" * electrolyte abnormalities: follow up with primary care for further evaluation of your sodium and potassium . Eat a balanced diet and maintain hydration. If you experience cramping, spasms, altered mental status, heart palpitations, please seek emergent care  Roselle Locus, MD 09/27/2023, 9:18 AM

## 2023-09-27 NOTE — Discharge Instructions (Addendum)
Prescriptions for new medications provided for the patient to bridge to follow up appointment. The patient was informed that refills for these prescriptions are generally not provided, and patient is encouraged to attend all follow up appointments to address medication refills and adjustments.   Today's discharge was reviewed with treatment team, and the team is in agreement that the patient is ready for discharge. The patient is was of the discharge plan for today and has been given opportunity to ask questions. At time of discharge, the patient does not vocalize any acute harm to self or others, is goal directed, able to advocate for self and organizational baseline.   At discharge, the patient is instructed to:  Take all medications as prescribed. Report any adverse effects and or reactions from the medicines to her outpatient provider promptly.  Do not engage in alcohol and/or illegal drug use while on prescription medicines.  In the event of worsening symptoms, patient is instructed to call the crisis hotline, 911 and or go to the nearest ED for appropriate evaluation and treatment of symptoms.  Follow-up with primary care provider for further care of medical issues, concerns and or health care needs. * Substance abuse follow up: it is recommended that you follow up with community support treatment, like AA/NA. It is also recommended that the patient attend 90 meetings in 90 days, otherwise known as "90 in 90" * electrolyte abnormalities: follow up with primary care for further evaluation of your sodium and potassium . Eat a balanced diet and maintain hydration. If you experience cramping, spasms, altered mental status, heart palpitations, please seek emergent care
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
## 2023-09-27 NOTE — Plan of Care (Signed)
  Problem: Education: Goal: Mental status will improve Outcome: Progressing   Problem: Coping: Goal: Ability to verbalize frustrations and anger appropriately will improve Outcome: Progressing   

## 2023-09-27 NOTE — Progress Notes (Signed)
   09/27/23 0530  15 Minute Checks  Location Bedroom  Visual Appearance Calm  Behavior Sleeping  Sleep (Behavioral Health Patients Only)  Calculate sleep? (Click Yes once per 24 hr at 0600 safety check) Yes  Documented sleep last 24 hours 9.25

## 2023-09-27 NOTE — Progress Notes (Signed)
D/c Instructions-Education: Cephus [place]ing Discharge instructions given to patient with verbalization of understanding. D/c education completed with patient including follow up instructions, medication list, d/c activities limitations if indicated, with other d/c instructions as indicated by MD - patient able to verbalize understanding, all questions fully answered. Patient denied SI/HI/AVH. Suicide prevention information given and discussed with patient and understanding of D/C information verbalized by patient. All questions asked were answered by staff, patient stated they have no questions at time of D/C. Patient received all belongings brought to the unit at discharge. Patient showed appreciation of care given to him at the [facility] unit. Patient was escorted to the lobby and left for home in private auto.    Melvenia Needles, RN

## 2024-07-24 ENCOUNTER — Other Ambulatory Visit: Payer: Self-pay

## 2024-07-24 ENCOUNTER — Emergency Department

## 2024-07-24 ENCOUNTER — Emergency Department: Admission: EM | Admit: 2024-07-24 | Discharge: 2024-07-24 | Disposition: A | Discharge status: Home / Self Care

## 2024-07-24 DIAGNOSIS — S3991XA Unspecified injury of abdomen, initial encounter: Secondary | ICD-10-CM | POA: Insufficient documentation

## 2024-07-24 DIAGNOSIS — S0990XA Unspecified injury of head, initial encounter: Secondary | ICD-10-CM | POA: Diagnosis not present

## 2024-07-24 DIAGNOSIS — E875 Hyperkalemia: Secondary | ICD-10-CM | POA: Insufficient documentation

## 2024-07-24 DIAGNOSIS — S62306A Unspecified fracture of fifth metacarpal bone, right hand, initial encounter for closed fracture: Secondary | ICD-10-CM

## 2024-07-24 DIAGNOSIS — S6991XA Unspecified injury of right wrist, hand and finger(s), initial encounter: Secondary | ICD-10-CM | POA: Diagnosis present

## 2024-07-24 DIAGNOSIS — S32009A Unspecified fracture of unspecified lumbar vertebra, initial encounter for closed fracture: Secondary | ICD-10-CM

## 2024-07-24 DIAGNOSIS — S62316A Displaced fracture of base of fifth metacarpal bone, right hand, initial encounter for closed fracture: Secondary | ICD-10-CM | POA: Insufficient documentation

## 2024-07-24 DIAGNOSIS — S5011XA Contusion of right forearm, initial encounter: Secondary | ICD-10-CM | POA: Diagnosis not present

## 2024-07-24 DIAGNOSIS — S8011XA Contusion of right lower leg, initial encounter: Secondary | ICD-10-CM | POA: Insufficient documentation

## 2024-07-24 DIAGNOSIS — S299XXA Unspecified injury of thorax, initial encounter: Secondary | ICD-10-CM | POA: Insufficient documentation

## 2024-07-24 DIAGNOSIS — S32059A Unspecified fracture of fifth lumbar vertebra, initial encounter for closed fracture: Secondary | ICD-10-CM | POA: Diagnosis not present

## 2024-07-24 DIAGNOSIS — S5012XA Contusion of left forearm, initial encounter: Secondary | ICD-10-CM | POA: Insufficient documentation

## 2024-07-24 DIAGNOSIS — M7989 Other specified soft tissue disorders: Secondary | ICD-10-CM | POA: Insufficient documentation

## 2024-07-24 DIAGNOSIS — S022XXA Fracture of nasal bones, initial encounter for closed fracture: Secondary | ICD-10-CM | POA: Insufficient documentation

## 2024-07-24 DIAGNOSIS — S8012XA Contusion of left lower leg, initial encounter: Secondary | ICD-10-CM | POA: Diagnosis not present

## 2024-07-24 DIAGNOSIS — Z23 Encounter for immunization: Secondary | ICD-10-CM | POA: Diagnosis not present

## 2024-07-24 LAB — COMPREHENSIVE METABOLIC PANEL WITH GFR
ALT: 16 U/L (ref 0–44)
AST: 52 U/L — ABNORMAL HIGH (ref 15–41)
Albumin: 3.6 g/dL (ref 3.5–5.0)
Alkaline Phosphatase: 45 U/L (ref 38–126)
Anion gap: 13 (ref 5–15)
BUN: 10 mg/dL (ref 6–20)
CO2: 28 mmol/L (ref 22–32)
Calcium: 9.2 mg/dL (ref 8.9–10.3)
Chloride: 104 mmol/L (ref 98–111)
Creatinine, Ser: 0.83 mg/dL (ref 0.61–1.24)
GFR, Estimated: >60 mL/min (ref >60)
Glucose, Bld: 134 mg/dL — ABNORMAL HIGH (ref 70–99)
Potassium: 3.1 mmol/L — ABNORMAL LOW (ref 3.5–5.1)
Sodium: 145 mmol/L (ref 135–145)
Total Bilirubin: 0.7 mg/dL (ref 0.0–1.2)
Total Protein: 7.7 g/dL (ref 6.5–8.1)

## 2024-07-24 LAB — CBC WITH DIFFERENTIAL/PLATELET
Abs Immature Granulocytes: 0.02 K/uL (ref 0.00–0.07)
Basophils Absolute: 0 K/uL (ref 0.0–0.1)
Basophils Relative: 0 %
Eosinophils Absolute: 0 K/uL (ref 0.0–0.5)
Eosinophils Relative: 0 %
HCT: 32.8 % — ABNORMAL LOW (ref 39.0–52.0)
Hemoglobin: 10.9 g/dL — ABNORMAL LOW (ref 13.0–17.0)
Immature Granulocytes: 0 %
Lymphocytes Relative: 19 %
Lymphs Abs: 1.2 K/uL (ref 0.7–4.0)
MCH: 27.4 pg (ref 26.0–34.0)
MCHC: 33.2 g/dL (ref 30.0–36.0)
MCV: 82.4 fL (ref 80.0–100.0)
Monocytes Absolute: 0.9 K/uL (ref 0.1–1.0)
Monocytes Relative: 14 %
Neutro Abs: 4.3 K/uL (ref 1.7–7.7)
Neutrophils Relative %: 67 %
Platelets: 165 K/uL (ref 150–400)
RBC: 3.98 MIL/uL — ABNORMAL LOW (ref 4.22–5.81)
RDW: 15.5 % (ref 11.5–15.5)
WBC: 6.5 K/uL (ref 4.0–10.5)
nRBC: 0 % (ref 0.0–0.2)

## 2024-07-24 LAB — TYPE AND SCREEN
ABO/RH(D): A POS
Antibody Screen: NEGATIVE

## 2024-07-24 LAB — LIPASE, BLOOD: Lipase: 31 U/L (ref 11–51)

## 2024-07-24 LAB — PROTIME-INR
INR: 1 (ref 0.8–1.2)
Prothrombin Time: 13.2 s (ref 11.4–15.2)

## 2024-07-24 MED ORDER — IOHEXOL 350 MG/ML SOLN
100.0000 mL | Freq: Once | INTRAVENOUS | Status: AC | PRN
  Administered 2024-07-24: 100 mL via INTRAVENOUS

## 2024-07-24 MED ORDER — IBUPROFEN 200 MG PO TABS: 600.0000 mg | ORAL_TABLET | Freq: Three times a day (TID) | ORAL | 2 refills | Status: AC | PRN

## 2024-07-24 MED ORDER — POTASSIUM CHLORIDE 20 MEQ PO PACK
40.0000 meq | PACK | Freq: Once | ORAL | Status: AC
  Administered 2024-07-24: 40 meq via ORAL
  Filled 2024-07-24: qty 2

## 2024-07-24 MED ORDER — ACETAMINOPHEN 500 MG PO TABS: 1000.0000 mg | ORAL_TABLET | Freq: Four times a day (QID) | ORAL | 2 refills | Status: AC | PRN

## 2024-07-24 MED ORDER — MORPHINE SULFATE (PF) 4 MG/ML IV SOLN
4.0000 mg | Freq: Once | INTRAVENOUS | Status: AC
  Administered 2024-07-24: 4 mg via INTRAVENOUS
  Filled 2024-07-24: qty 1

## 2024-07-24 MED ORDER — TETANUS-DIPHTH-ACELL PERTUSSIS 5-2.5-18.5 LF-MCG/0.5 IM SUSY
0.5000 mL | PREFILLED_SYRINGE | Freq: Once | INTRAMUSCULAR | Status: AC
  Administered 2024-07-24: 0.5 mL via INTRAMUSCULAR
  Filled 2024-07-24: qty 0.5

## 2024-07-24 MED ORDER — ACETAMINOPHEN 500 MG PO TABS
1000.0000 mg | ORAL_TABLET | Freq: Once | ORAL | Status: AC
  Administered 2024-07-24: 1000 mg via ORAL
  Filled 2024-07-24: qty 2

## 2024-07-24 NOTE — Discharge Instructions (Addendum)
 Your evaluation in the emergency department was notable for a fracture in your right hand as well as an nose fracture and a spinal fracture (L5 transverse process).  The right hand was splinted, and I placed a referral for you to follow-up with an orthopedic surgeon.  They will contact you to schedule an appointment.  I recommend you follow-up with your primary care provider regarding the nasal fracture and spinal fracture.
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
## 2024-08-31 ENCOUNTER — Ambulatory Visit: Admitting: Physician Assistant
